# Patient Record
Sex: Female | Born: 1945 | Race: White | Hispanic: No | Smoking: Current every day smoker
Health system: Southern US, Community
[De-identification: ages and names within clinical notes are randomized; demographics above are authoritative.]

## PROBLEM LIST (undated history)

## (undated) DIAGNOSIS — N189 Chronic kidney disease, unspecified: Secondary | ICD-10-CM

## (undated) DIAGNOSIS — K219 Gastro-esophageal reflux disease without esophagitis: Secondary | ICD-10-CM

## (undated) DIAGNOSIS — M1990 Unspecified osteoarthritis, unspecified site: Secondary | ICD-10-CM

## (undated) DIAGNOSIS — Z87442 Personal history of urinary calculi: Secondary | ICD-10-CM

## (undated) DIAGNOSIS — F32A Depression, unspecified: Secondary | ICD-10-CM

## (undated) DIAGNOSIS — Z8719 Personal history of other diseases of the digestive system: Secondary | ICD-10-CM

## (undated) DIAGNOSIS — F329 Major depressive disorder, single episode, unspecified: Secondary | ICD-10-CM

## (undated) HISTORY — PX: ABDOMINAL HYSTERECTOMY: SHX81

## (undated) HISTORY — PX: TONSILLECTOMY: SUR1361

## (undated) HISTORY — PX: TUBAL LIGATION: SHX77

## (undated) HISTORY — PX: COLONOSCOPY W/ BIOPSIES AND POLYPECTOMY: SHX1376

---

## 2006-06-29 HISTORY — PX: CAROTID ENDARTERECTOMY: SUR193

## 2010-06-29 HISTORY — PX: HERNIA REPAIR: SHX51

## 2010-11-28 HISTORY — PX: GASTRIC RESTRICTION SURGERY: SHX653

## 2013-05-09 ENCOUNTER — Other Ambulatory Visit: Payer: Self-pay | Admitting: Orthopedic Surgery

## 2013-05-11 ENCOUNTER — Other Ambulatory Visit: Payer: Self-pay | Admitting: Orthopedic Surgery

## 2013-05-16 ENCOUNTER — Encounter (HOSPITAL_COMMUNITY): Payer: Self-pay | Admitting: Pharmacy Technician

## 2013-05-17 ENCOUNTER — Encounter (HOSPITAL_COMMUNITY)
Admission: RE | Admit: 2013-05-17 | Discharge: 2013-05-17 | Disposition: A | Discharge status: Home / Self Care | Payer: Medicare Other | Source: Ambulatory Visit | Attending: Orthopedic Surgery | Admitting: Orthopedic Surgery

## 2013-05-17 ENCOUNTER — Encounter (HOSPITAL_COMMUNITY): Payer: Self-pay

## 2013-05-17 DIAGNOSIS — Z01810 Encounter for preprocedural cardiovascular examination: Secondary | ICD-10-CM | POA: Insufficient documentation

## 2013-05-17 DIAGNOSIS — Z01818 Encounter for other preprocedural examination: Secondary | ICD-10-CM | POA: Insufficient documentation

## 2013-05-17 DIAGNOSIS — Z01812 Encounter for preprocedural laboratory examination: Secondary | ICD-10-CM | POA: Insufficient documentation

## 2013-05-17 HISTORY — DX: Major depressive disorder, single episode, unspecified: F32.9

## 2013-05-17 HISTORY — DX: Gastro-esophageal reflux disease without esophagitis: K21.9

## 2013-05-17 HISTORY — DX: Personal history of urinary calculi: Z87.442

## 2013-05-17 HISTORY — DX: Unspecified osteoarthritis, unspecified site: M19.90

## 2013-05-17 HISTORY — DX: Personal history of other diseases of the digestive system: Z87.19

## 2013-05-17 HISTORY — DX: Depression, unspecified: F32.A

## 2013-05-17 LAB — CBC WITH DIFFERENTIAL/PLATELET
Basophils Relative: 0 % (ref 0–1)
Eosinophils Absolute: 0.4 10*3/uL (ref 0.0–0.7)
Eosinophils Relative: 6 % — ABNORMAL HIGH (ref 0–5)
Hemoglobin: 14.9 g/dL (ref 12.0–15.0)
Lymphs Abs: 1.7 10*3/uL (ref 0.7–4.0)
MCH: 33.3 pg (ref 26.0–34.0)
MCHC: 34.7 g/dL (ref 30.0–36.0)
MCV: 95.8 fL (ref 78.0–100.0)
Monocytes Absolute: 0.5 10*3/uL (ref 0.1–1.0)
Monocytes Relative: 8 % (ref 3–12)
Neutrophils Relative %: 63 % (ref 43–77)
Platelets: 249 10*3/uL (ref 150–400)
RBC: 4.48 MIL/uL (ref 3.87–5.11)

## 2013-05-17 LAB — URINALYSIS, ROUTINE W REFLEX MICROSCOPIC
Bilirubin Urine: NEGATIVE
Hgb urine dipstick: NEGATIVE
Ketones, ur: NEGATIVE mg/dL
Nitrite: NEGATIVE
Protein, ur: NEGATIVE mg/dL
Urobilinogen, UA: 0.2 mg/dL (ref 0.0–1.0)

## 2013-05-17 LAB — COMPREHENSIVE METABOLIC PANEL
Albumin: 3.8 g/dL (ref 3.5–5.2)
BUN: 13 mg/dL (ref 6–23)
Calcium: 9.6 mg/dL (ref 8.4–10.5)
Creatinine, Ser: 0.94 mg/dL (ref 0.50–1.10)
GFR calc Af Amer: 71 mL/min — ABNORMAL LOW (ref >90)
Glucose, Bld: 75 mg/dL (ref 70–99)
Total Protein: 6.7 g/dL (ref 6.0–8.3)

## 2013-05-17 LAB — TYPE AND SCREEN
ABO/RH(D): O POS
Antibody Screen: NEGATIVE

## 2013-05-17 LAB — PROTIME-INR
INR: 0.95 (ref 0.00–1.49)
Prothrombin Time: 12.5 seconds (ref 11.6–15.2)

## 2013-05-17 LAB — SURGICAL PCR SCREEN
MRSA, PCR: NEGATIVE
Staphylococcus aureus: NEGATIVE

## 2013-05-17 IMAGING — CR DG CHEST 2V
2 series · 2 of 2 positions shown · non-contrast
Comparison: None.

CLINICAL DATA: Preop.

EXAM:
CHEST  2 VIEW

[w chest pa]
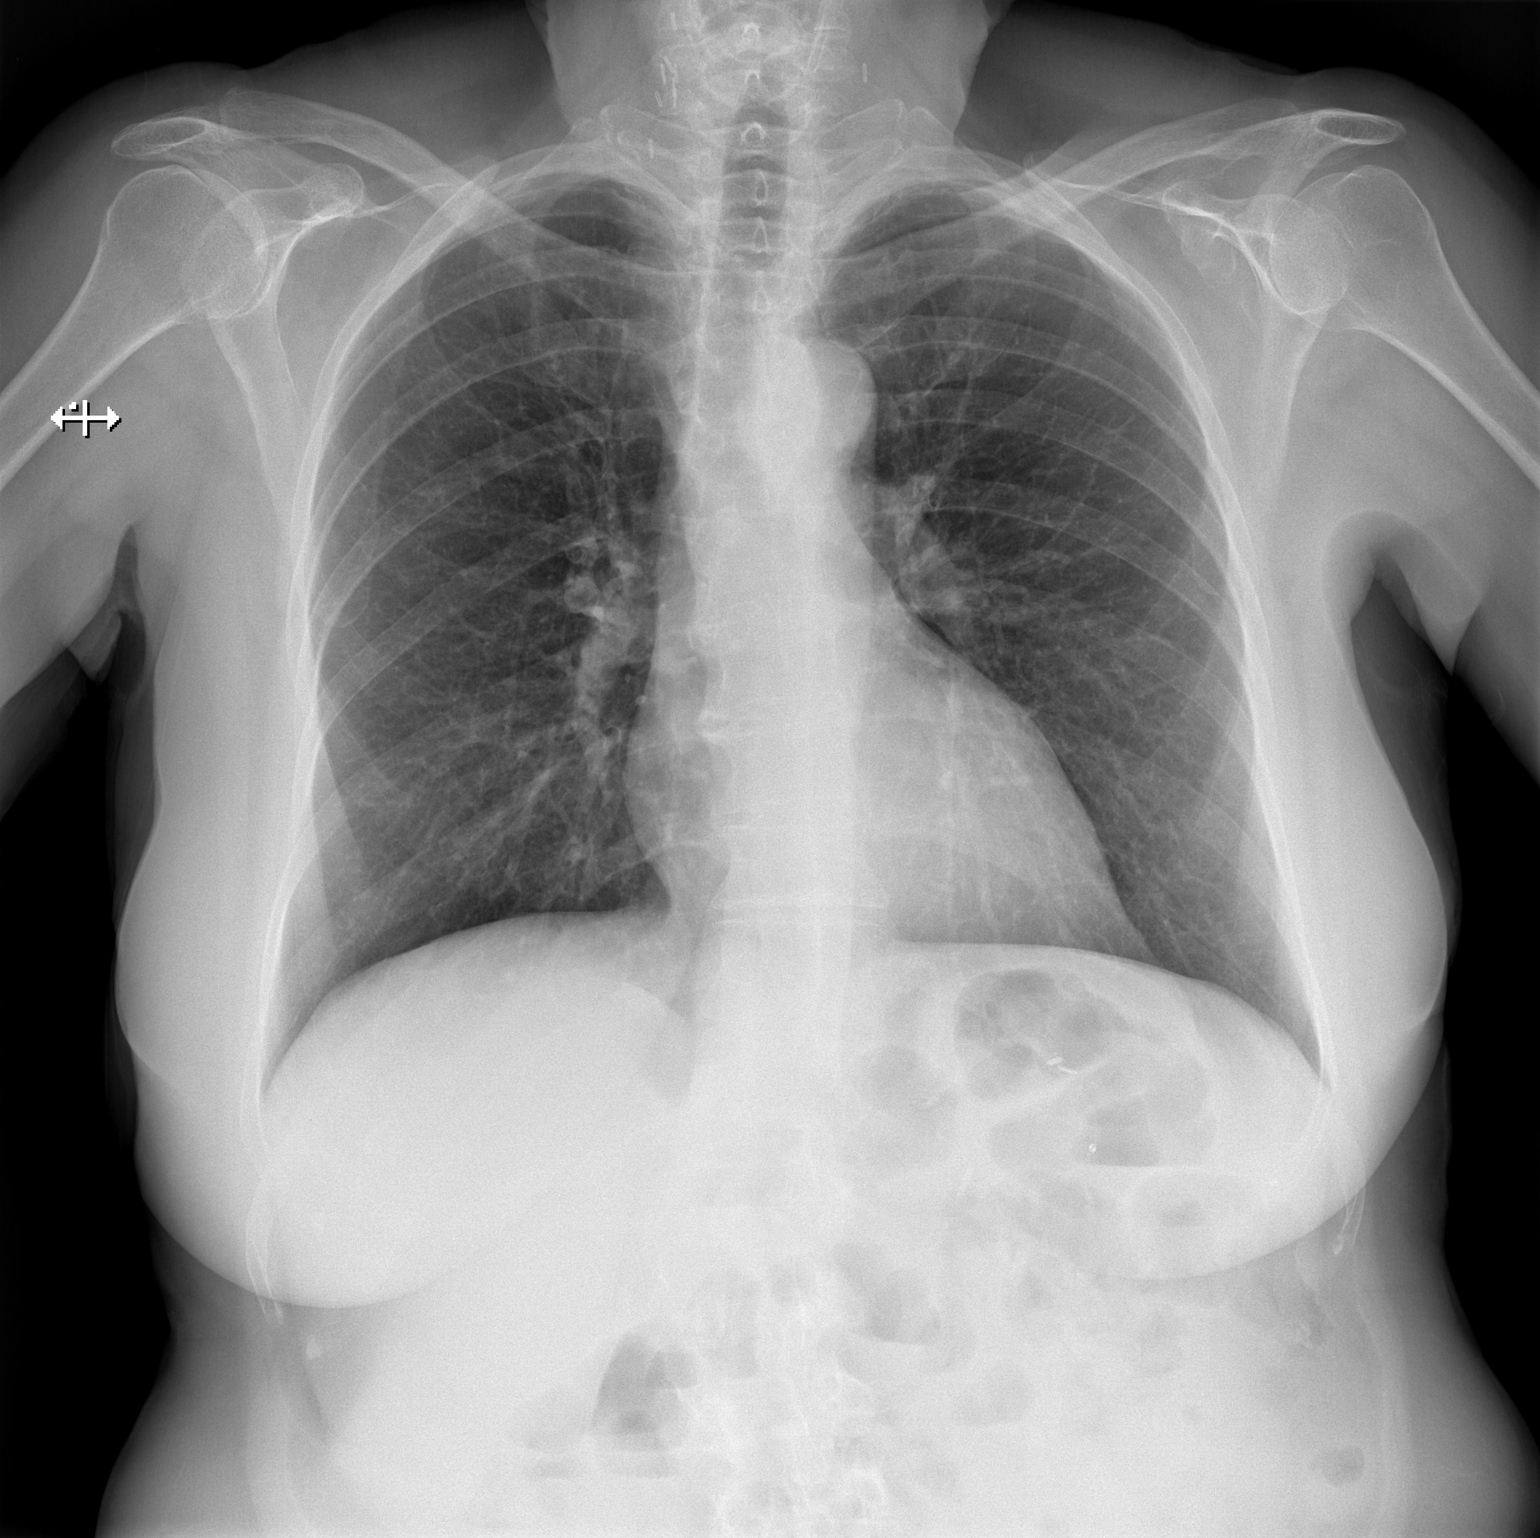

[w chest lat]
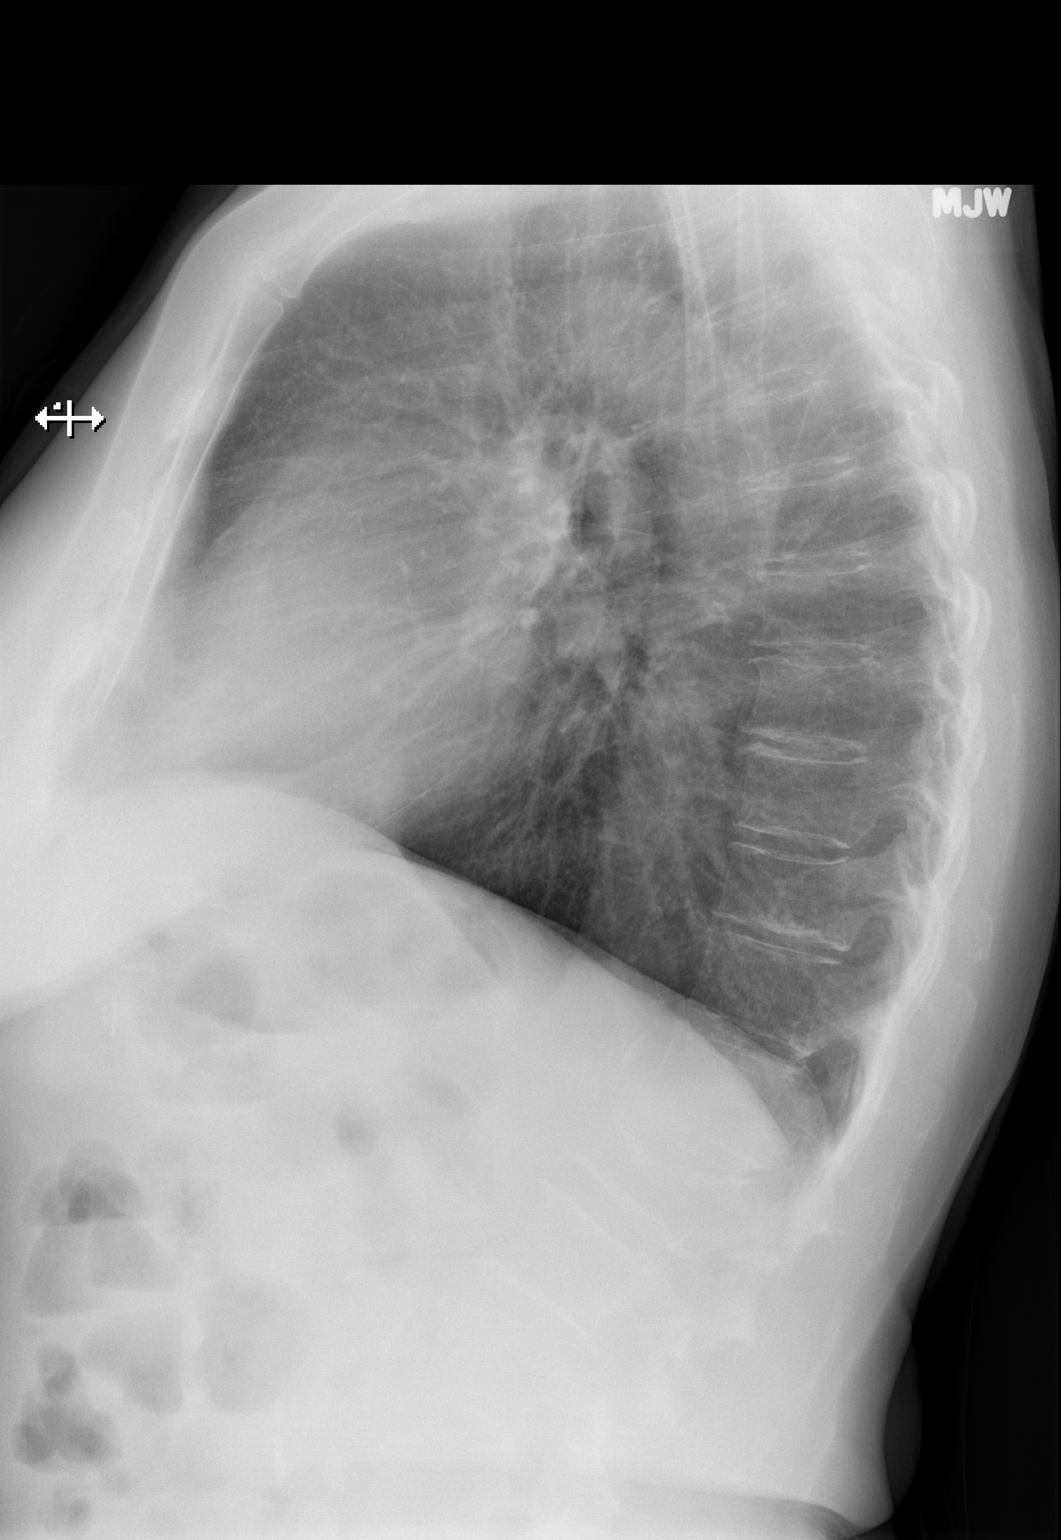

[2 of 2 positions shown; findings below may reference images not displayed]

FINDINGS: The cardiomediastinal silhouette is within normal limits. The lungs
are well inflated and clear. There is no evidence of pleural
effusion or pneumothorax. No acute osseous abnormality is
identified. Surgical clips and suture material are noted in the left
upper quadrant. Surgical clips are also noted in the lower neck.
IMPRESSION: Clear lungs.

## 2013-05-17 IMAGING — CR DG LUMBAR SPINE 2-3V
2 series · 2 of 2 positions shown · non-contrast
Comparison: Lumbar spine MRI [DATE]

CLINICAL DATA: Lumbar spondylosis.

EXAM:
LUMBAR SPINE - 2-3 VIEW

[t lumbar spine ap]
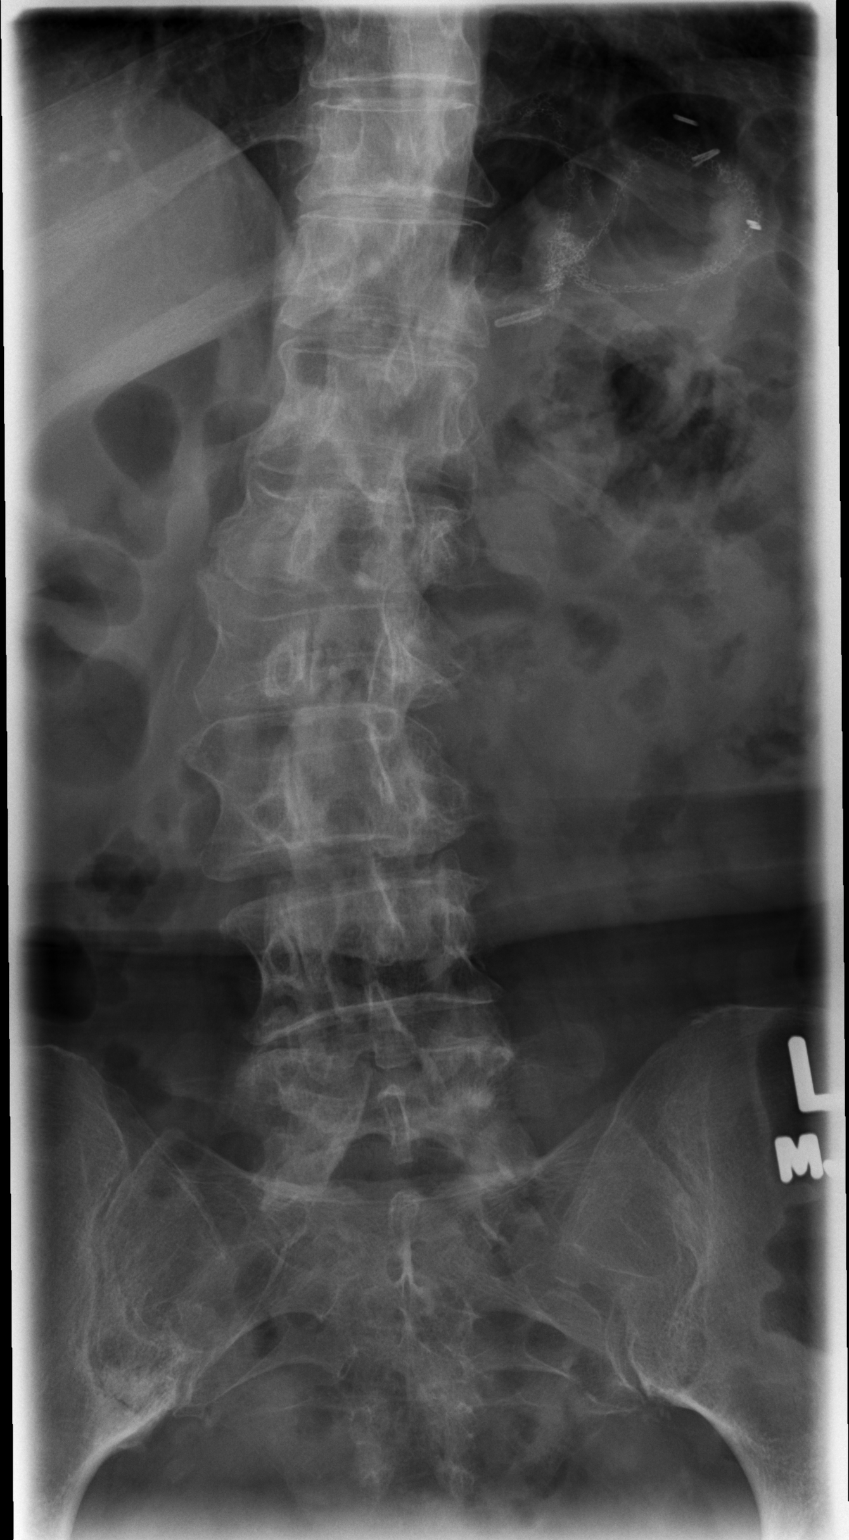

[t lumbar spine lat]
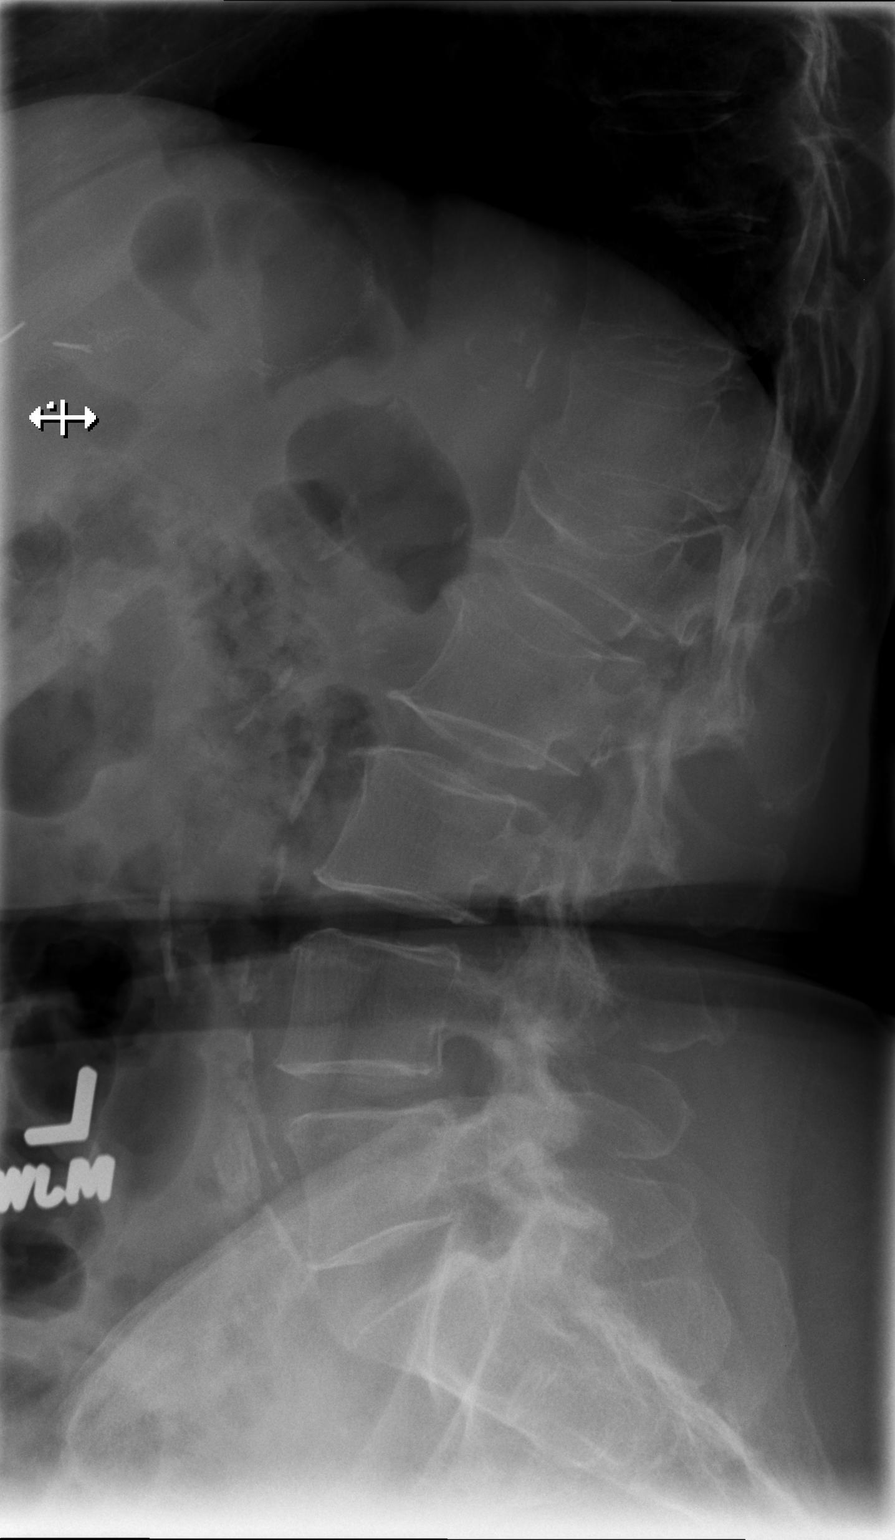

[2 of 2 positions shown; findings below may reference images not displayed]

FINDINGS: There is mild rotatory lumbar dextroscoliosis. Moderate L1
compression fracture is unchanged from prior MRI. Associated
retropulsion is better evaluated on prior MRI. No listhesis is
identified. There is mild disc space narrowing at L4-5. Anterior
osteophytosis is noted at L1-2 and L2-3. There is mild facet
arthrosis in the lower lumbar spine. There is no evidence of acute
fracture. The bones appear osteopenic. Suture material is noted in
the left upper quadrant.
IMPRESSION: Chronic L1 compression fracture and lumbar dextroscoliosis. No acute
osseous abnormality identified.

## 2013-05-17 NOTE — Pre-Procedure Instructions (Addendum)
Vicki Liu  05/17/2013   Your procedure is scheduled on:  05/24/13  Report to [facility] short stay admitting at 630 AM.  Call this number if you have problems the morning of surgery: [phone]   Remember:   Do not eat food or drink liquids after midnight.   Take these medicines the morning of surgery with A SIP OF WATER: lexapro, omeprazole, pain med if needed          STOP all herbel meds, nsaids (aleve,naproxen,advil,ibuprofen) 5 days prior to surgery including vitamins   Do not wear jewelry, make-up or nail polish.  Do not wear lotions, powders, or perfumes. You may wear deodorant.  Do not shave 48 hours prior to surgery. Men may shave face and neck.  Do not bring valuables to the hospital.  [facility] is not responsible                  for any belongings or valuables.               Contacts, dentures or bridgework may not be worn into surgery.  Leave suitcase in the car. After surgery it may be brought to your room.  For patients admitted to the hospital, discharge time is determined by your                treatment team.               Patients discharged the day of surgery will not be allowed to drive  home.  Name and phone number of your driver:   Special Instructions: Incentive Spirometry - Practice and bring it with you on the day of surgery. Shower using CHG 2 nights before surgery and the night before surgery.  If you shower the day of surgery use CHG.  Use special wash - you have one bottle of CHG for all showers.  You should use approximately 1/3 of the bottle for each shower.   Please read over the following fact sheets that you were given: Pain Booklet, Coughing and Deep Breathing, Blood Transfusion Information, MRSA Information and Surgical Site Infection Prevention

## 2013-05-17 NOTE — Progress Notes (Addendum)
req'd office note, ekg, any tests from dr [facility] [phone]

## 2013-05-23 MED ORDER — CEFAZOLIN SODIUM-DEXTROSE 2-3 GM-% IV SOLR
2.0000 g | INTRAVENOUS | Status: AC
  Administered 2013-05-24 (×2): 2 g via INTRAVENOUS
  Filled 2013-05-23: qty 50

## 2013-05-24 ENCOUNTER — Inpatient Hospital Stay (HOSPITAL_COMMUNITY): Payer: Medicare Other

## 2013-05-24 ENCOUNTER — Encounter (HOSPITAL_COMMUNITY): Payer: Medicare Other | Admitting: Certified Registered"

## 2013-05-24 ENCOUNTER — Encounter (HOSPITAL_COMMUNITY)
Admission: RE | Disposition: A | Discharge status: Home Health | Payer: Medicare Other | Source: Ambulatory Visit | Attending: Orthopedic Surgery

## 2013-05-24 ENCOUNTER — Encounter (HOSPITAL_COMMUNITY): Payer: Self-pay | Admitting: *Deleted

## 2013-05-24 ENCOUNTER — Inpatient Hospital Stay (HOSPITAL_COMMUNITY): Payer: Medicare Other | Admitting: Certified Registered"

## 2013-05-24 ENCOUNTER — Inpatient Hospital Stay (HOSPITAL_COMMUNITY)
Admission: RE | Admit: 2013-05-24 | Discharge: 2013-05-27 | DRG: 455 | Disposition: A | Discharge status: Home Health | Payer: Medicare Other | Source: Ambulatory Visit | Attending: Orthopedic Surgery | Admitting: Orthopedic Surgery

## 2013-05-24 DIAGNOSIS — M48061 Spinal stenosis, lumbar region without neurogenic claudication: Secondary | ICD-10-CM | POA: Diagnosis present

## 2013-05-24 DIAGNOSIS — T4275XA Adverse effect of unspecified antiepileptic and sedative-hypnotic drugs, initial encounter: Secondary | ICD-10-CM | POA: Diagnosis not present

## 2013-05-24 DIAGNOSIS — F17200 Nicotine dependence, unspecified, uncomplicated: Secondary | ICD-10-CM | POA: Diagnosis present

## 2013-05-24 DIAGNOSIS — Z7982 Long term (current) use of aspirin: Secondary | ICD-10-CM

## 2013-05-24 DIAGNOSIS — F3289 Other specified depressive episodes: Secondary | ICD-10-CM | POA: Diagnosis present

## 2013-05-24 DIAGNOSIS — F329 Major depressive disorder, single episode, unspecified: Secondary | ICD-10-CM | POA: Diagnosis present

## 2013-05-24 DIAGNOSIS — M4180 Other forms of scoliosis, site unspecified: Secondary | ICD-10-CM | POA: Diagnosis not present

## 2013-05-24 DIAGNOSIS — R0609 Other forms of dyspnea: Secondary | ICD-10-CM | POA: Diagnosis not present

## 2013-05-24 DIAGNOSIS — M129 Arthropathy, unspecified: Secondary | ICD-10-CM | POA: Diagnosis present

## 2013-05-24 DIAGNOSIS — M5410 Radiculopathy, site unspecified: Secondary | ICD-10-CM | POA: Diagnosis present

## 2013-05-24 DIAGNOSIS — K219 Gastro-esophageal reflux disease without esophagitis: Secondary | ICD-10-CM | POA: Diagnosis present

## 2013-05-24 DIAGNOSIS — R0989 Other specified symptoms and signs involving the circulatory and respiratory systems: Secondary | ICD-10-CM | POA: Diagnosis not present

## 2013-05-24 DIAGNOSIS — Z79899 Other long term (current) drug therapy: Secondary | ICD-10-CM

## 2013-05-24 HISTORY — PX: ANTERIOR LAT LUMBAR FUSION: SHX1168

## 2013-05-24 LAB — POCT I-STAT 7, (LYTES, BLD GAS, ICA,H+H)
Acid-base deficit: 4 mmol/L — ABNORMAL HIGH (ref 0.0–2.0)
Acid-base deficit: 4 mmol/L — ABNORMAL HIGH (ref 0.0–2.0)
Bicarbonate: 25.3 mEq/L — ABNORMAL HIGH (ref 20.0–24.0)
Calcium, Ion: 1.27 mmol/L (ref 1.13–1.30)
Calcium, Ion: 1.28 mmol/L (ref 1.13–1.30)
HCT: 38 % (ref 36.0–46.0)
HCT: 38 % (ref 36.0–46.0)
O2 Saturation: 100 %
O2 Saturation: 96 %
Potassium: 4.1 mEq/L (ref 3.5–5.1)
Sodium: 139 mEq/L (ref 135–145)
pO2, Arterial: 98 mmHg (ref 80.0–100.0)

## 2013-05-24 LAB — POCT I-STAT 4, (NA,K, GLUC, HGB,HCT)
Glucose, Bld: 94 mg/dL (ref 70–99)
HCT: 35 % — ABNORMAL LOW (ref 36.0–46.0)
Potassium: 3.8 mEq/L (ref 3.5–5.1)
Sodium: 143 mEq/L (ref 135–145)

## 2013-05-24 IMAGING — RF DG LUMBAR SPINE COMPLETE 4+V
1 series · 4 of 4 positions shown · non-contrast
Comparison: Lumbar spine radiographs [DATE]

CLINICAL DATA: L2-5 XLIF.

EXAM:
LUMBAR SPINE - COMPLETE 4+ VIEW; DG C-ARM GT 120 MIN

[Series 1: run · 4 of 4 slices shown]
[im 1/4]
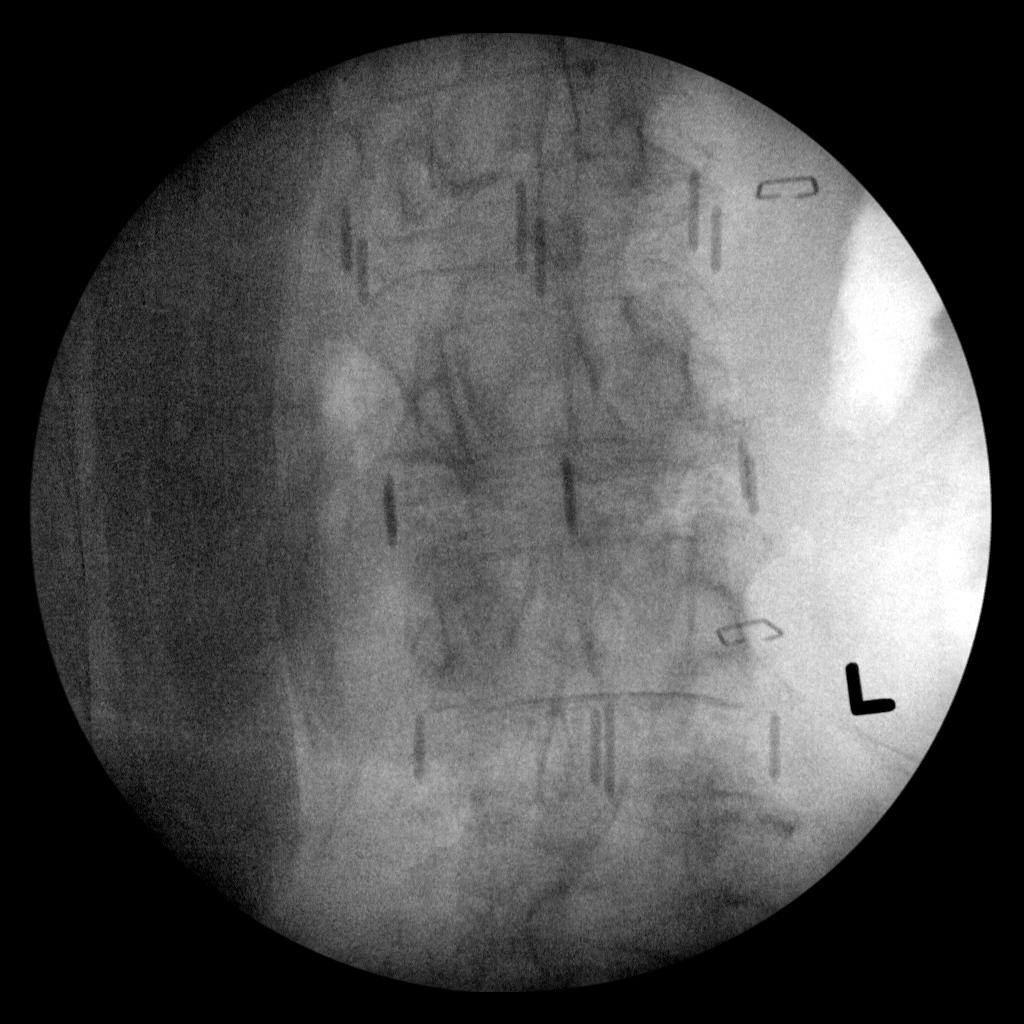
[im 2/4]
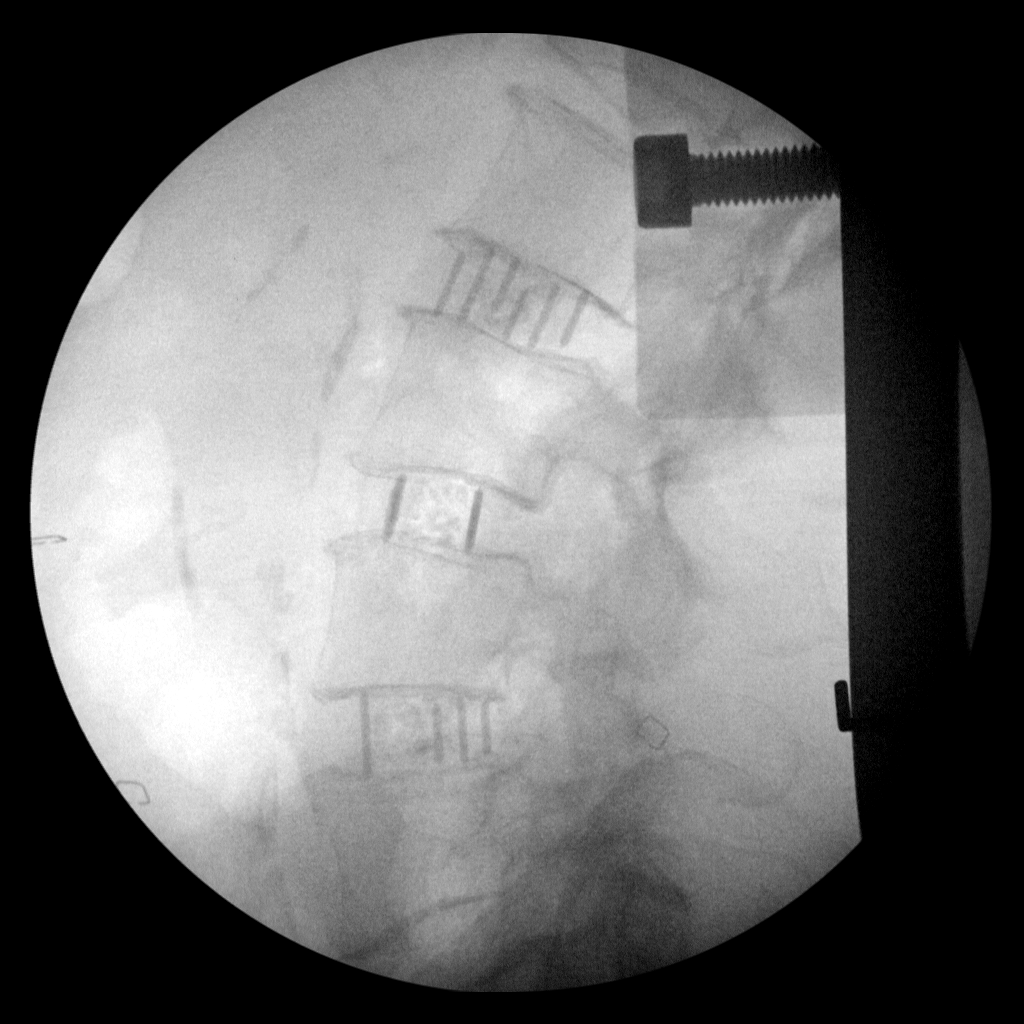
[im 3/4]
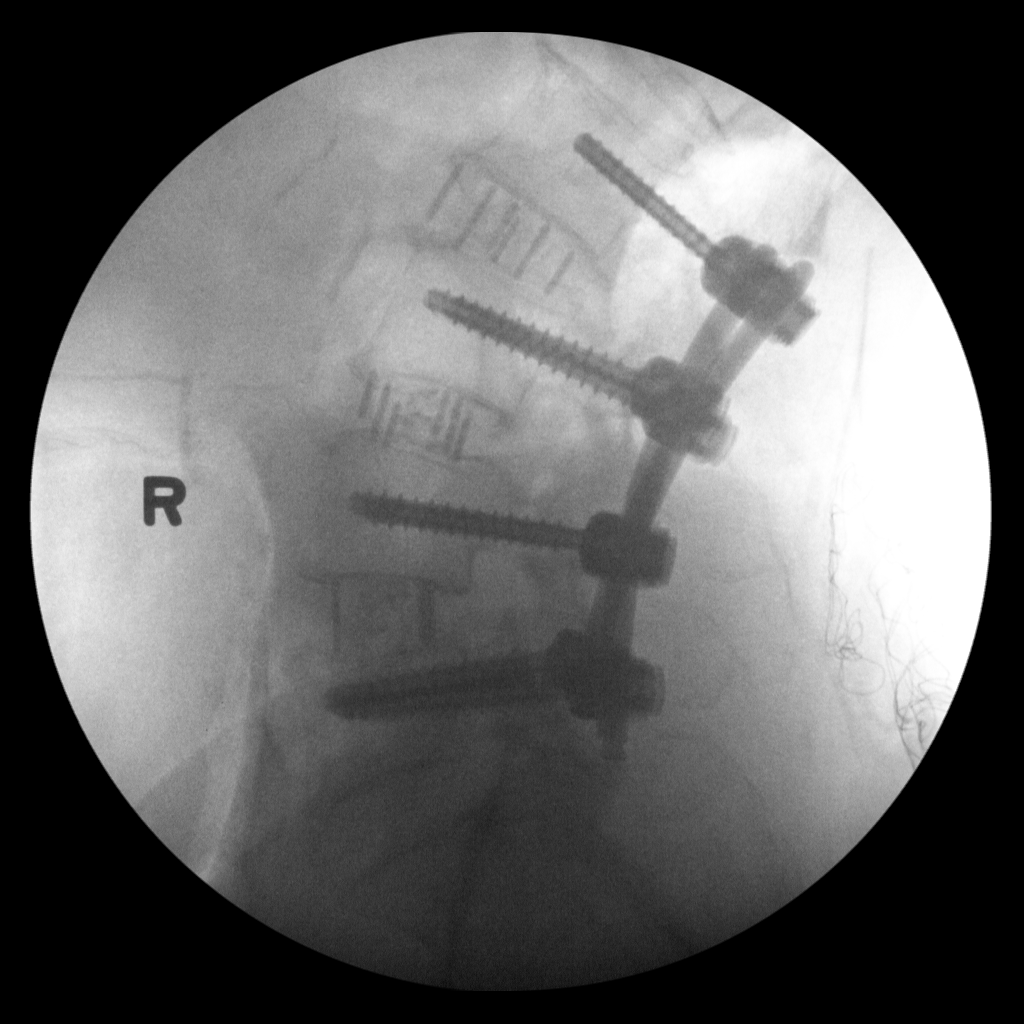
[im 4/4]
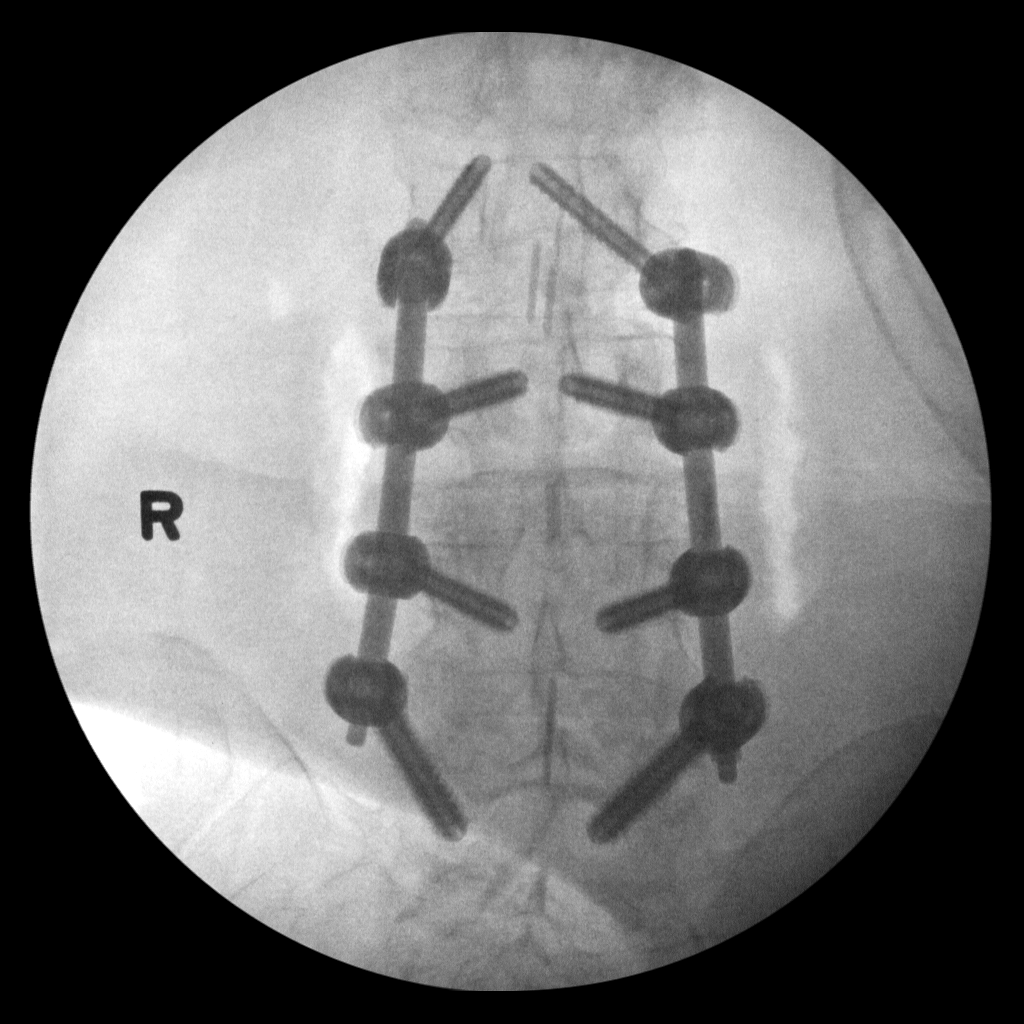

[4 of 4 positions shown; findings below may reference images not displayed]

FINDINGS: Four spot fluoroscopic images of the lumbar spine are submitted from
the operating room. These demonstrate the placement of XLIF devices
at L2-3, L3-4 and L4-5. There are paired pedicle screws and
interconnecting rods at each level. The hardware appears well
positioned. No complications are identified.
IMPRESSION: Intraoperative views during L2-5 XLIF. No demonstrated complication.

## 2013-05-24 SURGERY — ANTERIOR LATERAL LUMBAR FUSION 3 LEVELS
Anesthesia: General | Site: Spine Lumbar | Wound class: Clean

## 2013-05-24 MED ORDER — ZOLPIDEM TARTRATE 5 MG PO TABS: 5.0000 mg | ORAL_TABLET | Freq: Every evening | ORAL | Status: DC | PRN

## 2013-05-24 MED ORDER — ONDANSETRON HCL 4 MG PO TABS
4.0000 mg | ORAL_TABLET | Freq: Three times a day (TID) | ORAL | Status: DC | PRN
  Administered 2013-05-27: 4 mg via ORAL
  Filled 2013-05-24: qty 1

## 2013-05-24 MED ORDER — PANTOPRAZOLE SODIUM 40 MG PO TBEC
40.0000 mg | DELAYED_RELEASE_TABLET | Freq: Every day | ORAL | Status: DC
  Administered 2013-05-25: 40 mg via ORAL
  Filled 2013-05-24 (×2): qty 1

## 2013-05-24 MED ORDER — ESCITALOPRAM OXALATE 20 MG PO TABS
20.0000 mg | ORAL_TABLET | Freq: Every day | ORAL | Status: DC
  Administered 2013-05-25 – 2013-05-27 (×3): 20 mg via ORAL
  Filled 2013-05-24 (×3): qty 1

## 2013-05-24 MED ORDER — ONDANSETRON HCL 4 MG/2ML IJ SOLN: 4.0000 mg | INTRAMUSCULAR | Status: DC | PRN

## 2013-05-24 MED ORDER — FLEET ENEMA 7-19 GM/118ML RE ENEM: 1.0000 | ENEMA | Freq: Once | RECTAL | Status: AC | PRN

## 2013-05-24 MED ORDER — SODIUM CHLORIDE 0.9 % IV SOLN: 250.0000 mL | INTRAVENOUS | Status: DC

## 2013-05-24 MED ORDER — PROMETHAZINE HCL 12.5 MG PO TABS
12.5000 mg | ORAL_TABLET | Freq: Four times a day (QID) | ORAL | Status: DC | PRN
  Filled 2013-05-24: qty 1

## 2013-05-24 MED ORDER — HYDROMORPHONE 0.3 MG/ML IV SOLN
INTRAVENOUS | Status: AC
  Filled 2013-05-24: qty 25

## 2013-05-24 MED ORDER — BUPIVACAINE-EPINEPHRINE (PF) 0.25% -1:200000 IJ SOLN
INTRAMUSCULAR | Status: AC
  Filled 2013-05-24: qty 30

## 2013-05-24 MED ORDER — ACETAMINOPHEN 650 MG RE SUPP: 650.0000 mg | RECTAL | Status: DC | PRN

## 2013-05-24 MED ORDER — HYDROMORPHONE HCL 2 MG PO TABS: 1.0000 mg | ORAL_TABLET | ORAL | Status: DC | PRN

## 2013-05-24 MED ORDER — SODIUM CHLORIDE 0.9 % IV SOLN
INTRAVENOUS | Status: DC
  Administered 2013-05-24: 23:00:00 via INTRAVENOUS
  Administered 2013-05-25: 75 mL/h via INTRAVENOUS

## 2013-05-24 MED ORDER — CALCIUM-VITAMIN D 500-200 MG-UNIT PO TABS: 2.0000 | ORAL_TABLET | Freq: Two times a day (BID) | ORAL | Status: DC

## 2013-05-24 MED ORDER — FENTANYL CITRATE 0.05 MG/ML IJ SOLN
INTRAMUSCULAR | Status: DC | PRN
  Administered 2013-05-24: 50 ug via INTRAVENOUS
  Administered 2013-05-24: 100 ug via INTRAVENOUS
  Administered 2013-05-24 (×8): 50 ug via INTRAVENOUS
  Administered 2013-05-24 (×3): 100 ug via INTRAVENOUS
  Administered 2013-05-24 (×2): 50 ug via INTRAVENOUS
  Administered 2013-05-24: 150 ug via INTRAVENOUS
  Administered 2013-05-24: 50 ug via INTRAVENOUS
  Administered 2013-05-24 (×2): 100 ug via INTRAVENOUS

## 2013-05-24 MED ORDER — ONDANSETRON HCL 4 MG/2ML IJ SOLN: 4.0000 mg | Freq: Four times a day (QID) | INTRAMUSCULAR | Status: DC | PRN

## 2013-05-24 MED ORDER — HYDROMORPHONE HCL PF 1 MG/ML IJ SOLN
INTRAMUSCULAR | Status: DC | PRN
  Administered 2013-05-24 (×2): 0.5 mg via INTRAVENOUS

## 2013-05-24 MED ORDER — CEFAZOLIN SODIUM 1-5 GM-% IV SOLN
1.0000 g | Freq: Three times a day (TID) | INTRAVENOUS | Status: AC
  Administered 2013-05-24 – 2013-05-25 (×2): 1 g via INTRAVENOUS
  Filled 2013-05-24 (×2): qty 50

## 2013-05-24 MED ORDER — FENTANYL CITRATE 0.05 MG/ML IJ SOLN
25.0000 ug | INTRAMUSCULAR | Status: DC | PRN
  Administered 2013-05-24 (×5): 25 ug via INTRAVENOUS

## 2013-05-24 MED ORDER — LACTATED RINGERS IV SOLN
INTRAVENOUS | Status: DC
  Administered 2013-05-24 (×2): via INTRAVENOUS

## 2013-05-24 MED ORDER — FENTANYL CITRATE 0.05 MG/ML IJ SOLN
INTRAMUSCULAR | Status: AC
  Filled 2013-05-24: qty 2

## 2013-05-24 MED ORDER — DOCUSATE SODIUM 100 MG PO CAPS
100.0000 mg | ORAL_CAPSULE | Freq: Two times a day (BID) | ORAL | Status: DC
  Administered 2013-05-25 – 2013-05-27 (×5): 100 mg via ORAL
  Filled 2013-05-24 (×6): qty 1

## 2013-05-24 MED ORDER — LABETALOL HCL 5 MG/ML IV SOLN
5.0000 mg | Freq: Once | INTRAVENOUS | Status: AC
  Administered 2013-05-24: 5 mg via INTRAVENOUS

## 2013-05-24 MED ORDER — ROCURONIUM BROMIDE 100 MG/10ML IV SOLN: INTRAVENOUS | Status: DC | PRN

## 2013-05-24 MED ORDER — PROPOFOL INFUSION 10 MG/ML OPTIME
INTRAVENOUS | Status: DC | PRN
  Administered 2013-05-24: 25 ug/kg/min via INTRAVENOUS

## 2013-05-24 MED ORDER — PROMETHAZINE HCL 25 MG/ML IJ SOLN
12.5000 mg | Freq: Four times a day (QID) | INTRAMUSCULAR | Status: DC | PRN
  Administered 2013-05-27: 12.5 mg via INTRAVENOUS
  Filled 2013-05-24: qty 1

## 2013-05-24 MED ORDER — EPHEDRINE SULFATE 50 MG/ML IJ SOLN
INTRAMUSCULAR | Status: DC | PRN
  Administered 2013-05-24 (×3): 5 mg via INTRAVENOUS
  Administered 2013-05-24 (×2): 10 mg via INTRAVENOUS

## 2013-05-24 MED ORDER — DIPHENHYDRAMINE HCL 12.5 MG/5ML PO ELIX
12.5000 mg | ORAL_SOLUTION | Freq: Four times a day (QID) | ORAL | Status: DC | PRN
  Filled 2013-05-24: qty 5

## 2013-05-24 MED ORDER — THROMBIN 20000 UNITS EX SOLR
OROMUCOSAL | Status: DC | PRN
  Administered 2013-05-24: 08:00:00 via TOPICAL

## 2013-05-24 MED ORDER — THROMBIN 20000 UNITS EX SOLR
CUTANEOUS | Status: AC
  Filled 2013-05-24: qty 20000

## 2013-05-24 MED ORDER — POVIDONE-IODINE 7.5 % EX SOLN
Freq: Once | CUTANEOUS | Status: DC
  Filled 2013-05-24: qty 118

## 2013-05-24 MED ORDER — LACTATED RINGERS IV SOLN
INTRAVENOUS | Status: DC | PRN
  Administered 2013-05-24 (×3): via INTRAVENOUS

## 2013-05-24 MED ORDER — NALOXONE HCL 0.4 MG/ML IJ SOLN: 0.4000 mg | INTRAMUSCULAR | Status: DC | PRN

## 2013-05-24 MED ORDER — ALUM & MAG HYDROXIDE-SIMETH 200-200-20 MG/5ML PO SUSP
30.0000 mL | Freq: Four times a day (QID) | ORAL | Status: DC | PRN
  Administered 2013-05-27: 30 mL via ORAL
  Filled 2013-05-24: qty 30

## 2013-05-24 MED ORDER — ARTIFICIAL TEARS OP OINT
TOPICAL_OINTMENT | OPHTHALMIC | Status: DC | PRN
  Administered 2013-05-24: 1 via OPHTHALMIC

## 2013-05-24 MED ORDER — CALCIUM CARBONATE-VITAMIN D 500-200 MG-UNIT PO TABS
2.0000 | ORAL_TABLET | Freq: Two times a day (BID) | ORAL | Status: DC
  Administered 2013-05-25 – 2013-05-27 (×4): 2 via ORAL
  Filled 2013-05-24 (×6): qty 2

## 2013-05-24 MED ORDER — LABETALOL HCL 5 MG/ML IV SOLN
INTRAVENOUS | Status: AC
  Filled 2013-05-24: qty 4

## 2013-05-24 MED ORDER — BUPIVACAINE-EPINEPHRINE PF 0.25-1:200000 % IJ SOLN
INTRAMUSCULAR | Status: DC | PRN
  Administered 2013-05-24: 17 mL via PERINEURAL

## 2013-05-24 MED ORDER — BISACODYL 5 MG PO TBEC: 5.0000 mg | DELAYED_RELEASE_TABLET | Freq: Every day | ORAL | Status: DC | PRN

## 2013-05-24 MED ORDER — NALOXONE HCL 0.4 MG/ML IJ SOLN
INTRAMUSCULAR | Status: AC
  Filled 2013-05-24: qty 1

## 2013-05-24 MED ORDER — SODIUM CHLORIDE 0.9 % IJ SOLN: 3.0000 mL | INTRAMUSCULAR | Status: DC | PRN

## 2013-05-24 MED ORDER — DIAZEPAM 5 MG PO TABS: 5.0000 mg | ORAL_TABLET | Freq: Four times a day (QID) | ORAL | Status: DC | PRN

## 2013-05-24 MED ORDER — PROPOFOL 10 MG/ML IV BOLUS
INTRAVENOUS | Status: DC | PRN
  Administered 2013-05-24: 40 mg via INTRAVENOUS
  Administered 2013-05-24: 160 mg via INTRAVENOUS

## 2013-05-24 MED ORDER — PHENOL 1.4 % MT LIQD: 1.0000 | OROMUCOSAL | Status: DC | PRN

## 2013-05-24 MED ORDER — CEFAZOLIN SODIUM-DEXTROSE 2-3 GM-% IV SOLR
INTRAVENOUS | Status: AC
  Filled 2013-05-24: qty 50

## 2013-05-24 MED ORDER — HYDROMORPHONE HCL PF 1 MG/ML IJ SOLN
0.5000 mg | INTRAMUSCULAR | Status: DC | PRN
  Administered 2013-05-26: 1 mg via INTRAVENOUS
  Filled 2013-05-24 (×2): qty 1

## 2013-05-24 MED ORDER — PROMETHAZINE HCL 25 MG RE SUPP: 12.5000 mg | Freq: Four times a day (QID) | RECTAL | Status: DC | PRN

## 2013-05-24 MED ORDER — MIDAZOLAM HCL 5 MG/5ML IJ SOLN
INTRAMUSCULAR | Status: DC | PRN
  Administered 2013-05-24 (×3): 1 mg via INTRAVENOUS

## 2013-05-24 MED ORDER — HYDROMORPHONE 0.3 MG/ML IV SOLN
INTRAVENOUS | Status: DC
  Administered 2013-05-25: 2.33 mg via INTRAVENOUS
  Administered 2013-05-25: 3.39 mg via INTRAVENOUS
  Administered 2013-05-25: 1.99 mg via INTRAVENOUS
  Administered 2013-05-25: 0.2 mg via INTRAVENOUS
  Administered 2013-05-25: 03:00:00 via INTRAVENOUS
  Administered 2013-05-26: 1.99 mg via INTRAVENOUS
  Administered 2013-05-26: 0.739 mg via INTRAVENOUS
  Administered 2013-05-26: 0.999 mg via INTRAVENOUS
  Filled 2013-05-24: qty 25

## 2013-05-24 MED ORDER — LIDOCAINE HCL (CARDIAC) 20 MG/ML IV SOLN
INTRAVENOUS | Status: DC | PRN
  Administered 2013-05-24: 70 mg via INTRAVENOUS

## 2013-05-24 MED ORDER — ONDANSETRON HCL 4 MG/2ML IJ SOLN
INTRAMUSCULAR | Status: DC | PRN
  Administered 2013-05-24: 4 mg via INTRAVENOUS

## 2013-05-24 MED ORDER — MENTHOL 3 MG MT LOZG: 1.0000 | LOZENGE | OROMUCOSAL | Status: DC | PRN

## 2013-05-24 MED ORDER — SODIUM CHLORIDE 0.9 % IJ SOLN: 9.0000 mL | INTRAMUSCULAR | Status: DC | PRN

## 2013-05-24 MED ORDER — BUPIVACAINE-EPINEPHRINE 0.25% -1:200000 IJ SOLN
INTRAMUSCULAR | Status: DC | PRN
  Administered 2013-05-24: 7 mL

## 2013-05-24 MED ORDER — ALBUMIN HUMAN 5 % IV SOLN
INTRAVENOUS | Status: DC | PRN
  Administered 2013-05-24: 16:00:00 via INTRAVENOUS

## 2013-05-24 MED ORDER — MECLIZINE HCL 25 MG PO TABS
25.0000 mg | ORAL_TABLET | Freq: Every day | ORAL | Status: DC
  Administered 2013-05-25 – 2013-05-26 (×2): 25 mg via ORAL
  Filled 2013-05-24 (×4): qty 1

## 2013-05-24 MED ORDER — SODIUM CHLORIDE 0.9 % IJ SOLN
3.0000 mL | Freq: Two times a day (BID) | INTRAMUSCULAR | Status: DC
  Administered 2013-05-24 – 2013-05-27 (×4): 3 mL via INTRAVENOUS

## 2013-05-24 MED ORDER — DIPHENHYDRAMINE HCL 50 MG/ML IJ SOLN: 12.5000 mg | Freq: Four times a day (QID) | INTRAMUSCULAR | Status: DC | PRN

## 2013-05-24 MED ORDER — ACETAMINOPHEN 325 MG PO TABS
650.0000 mg | ORAL_TABLET | ORAL | Status: DC | PRN
  Administered 2013-05-26 – 2013-05-27 (×2): 650 mg via ORAL
  Filled 2013-05-24 (×2): qty 2

## 2013-05-24 MED ORDER — SENNOSIDES-DOCUSATE SODIUM 8.6-50 MG PO TABS
1.0000 | ORAL_TABLET | Freq: Every evening | ORAL | Status: DC | PRN
  Filled 2013-05-24: qty 1

## 2013-05-24 SURGICAL SUPPLY — 122 items
APPLIER CLIP 11 MED OPEN (CLIP)
ATTRACTOMAT 16X20 MAGNETIC DRP (DRAPES) IMPLANT
BENZOIN TINCTURE PRP APPL 2/3 (GAUZE/BANDAGES/DRESSINGS) ×6 IMPLANT
BLADE SURG 10 STRL SS (BLADE) ×6 IMPLANT
BLADE SURG ROTATE 9660 (MISCELLANEOUS) IMPLANT
BUR ROUND PRECISION 4.0 (BURR) IMPLANT
CARTRIDGE OIL MAESTRO DRILL (MISCELLANEOUS) IMPLANT
CLIP APPLIE 11 MED OPEN (CLIP) IMPLANT
CLIP NEUROVISION LG (CLIP) ×3 IMPLANT
CLOTH BEACON ORANGE TIMEOUT ST (SAFETY) ×3 IMPLANT
CLSR STERI-STRIP ANTIMIC 1/2X4 (GAUZE/BANDAGES/DRESSINGS) ×6 IMPLANT
CONT SPEC STER OR (MISCELLANEOUS) IMPLANT
CORDS BIPOLAR (ELECTRODE) ×3 IMPLANT
COROENT XL 12X22X55 (Orthopedic Implant) ×3 IMPLANT
COVER SURGICAL LIGHT HANDLE (MISCELLANEOUS) ×6 IMPLANT
DIFFUSER DRILL AIR PNEUMATIC (MISCELLANEOUS) IMPLANT
DRAIN CHANNEL 15F RND FF W/TCR (WOUND CARE) IMPLANT
DRAPE C-ARM 42X72 X-RAY (DRAPES) ×6 IMPLANT
DRAPE C-ARMOR (DRAPES) ×6 IMPLANT
DRAPE ORTHO SPLIT 77X108 STRL (DRAPES) ×1
DRAPE POUCH INSTRU U-SHP 10X18 (DRAPES) ×3 IMPLANT
DRAPE SURG 17X23 STRL (DRAPES) ×12 IMPLANT
DRAPE SURG ORHT 6 SPLT 77X108 (DRAPES) ×2 IMPLANT
DRAPE U-SHAPE 47X51 STRL (DRAPES) ×6 IMPLANT
DRSG MEPILEX BORDER 4X12 (GAUZE/BANDAGES/DRESSINGS) IMPLANT
DRSG MEPILEX BORDER 4X8 (GAUZE/BANDAGES/DRESSINGS) IMPLANT
DURAPREP 26ML APPLICATOR (WOUND CARE) ×6 IMPLANT
ELECT BLADE 4.0 EZ CLEAN MEGAD (MISCELLANEOUS) ×3
ELECT CAUTERY BLADE 6.4 (BLADE) ×6 IMPLANT
ELECT REM PT RETURN 9FT ADLT (ELECTROSURGICAL) ×6
ELECTRODE BLDE 4.0 EZ CLN MEGD (MISCELLANEOUS) ×2 IMPLANT
ELECTRODE REM PT RTRN 9FT ADLT (ELECTROSURGICAL) ×4 IMPLANT
EVACUATOR SILICONE 100CC (DRAIN) IMPLANT
FILTER STRAW FLUID ASPIR (MISCELLANEOUS) IMPLANT
GAUZE SPONGE 4X4 16PLY XRAY LF (GAUZE/BANDAGES/DRESSINGS) ×15 IMPLANT
GLOVE BIO SURGEON STRL SZ7 (GLOVE) ×6 IMPLANT
GLOVE BIO SURGEON STRL SZ8 (GLOVE) ×3 IMPLANT
GLOVE BIOGEL PI IND STRL 7.0 (GLOVE) ×4 IMPLANT
GLOVE BIOGEL PI IND STRL 8 (GLOVE) ×2 IMPLANT
GLOVE BIOGEL PI IND STRL 8.5 (GLOVE) ×2 IMPLANT
GLOVE BIOGEL PI INDICATOR 7.0 (GLOVE) ×2
GLOVE BIOGEL PI INDICATOR 8 (GLOVE) ×1
GLOVE BIOGEL PI INDICATOR 8.5 (GLOVE) ×1
GLOVE ECLIPSE 8.5 STRL (GLOVE) ×3 IMPLANT
GOWN PREVENTION PLUS XLARGE (GOWN DISPOSABLE) ×9 IMPLANT
GOWN STRL NON-REIN LRG LVL3 (GOWN DISPOSABLE) ×6 IMPLANT
GUIDEWIRE BLUNT VIPER II 1.45 (WIRE) ×3 IMPLANT
GUIDEWIRE SHARP VIPER II (WIRE) ×24 IMPLANT
IMPL COROENT XL 10X18X55 (Intraocular Lens) ×2 IMPLANT
IMPL COROENT XL 12X18X55 ×2 IMPLANT
IMPLANT COROENT XL 10X18X55 (Intraocular Lens) ×3 IMPLANT
IMPLANT COROENT XL 12X18X55 ×3 IMPLANT
IV CATH 14GX2 1/4 (CATHETERS) ×3 IMPLANT
KIT BASIN OR (CUSTOM PROCEDURE TRAY) ×3 IMPLANT
KIT DILATOR XLIF 5 (KITS) ×2 IMPLANT
KIT MAXCESS (KITS) ×3 IMPLANT
KIT NEEDLE NVM5 EMG ELECT (KITS) ×2 IMPLANT
KIT NEEDLE NVM5 EMG ELECTRODE (KITS) ×1
KIT POSITION SURG JACKSON T1 (MISCELLANEOUS) ×3 IMPLANT
KIT ROOM TURNOVER OR (KITS) ×3 IMPLANT
KIT XLIF (KITS) ×1
MARKER SKIN DUAL TIP RULER LAB (MISCELLANEOUS) ×6 IMPLANT
MIX DBX 10CC 35% BONE (Bone Implant) ×3 IMPLANT
MIX DBX 20CC MTF (Putty) ×3 IMPLANT
NEEDLE 22X1 1/2 (OR ONLY) (NEEDLE) ×3 IMPLANT
NEEDLE BONE MARROW 8GX6 FENEST (NEEDLE) IMPLANT
NEEDLE HYPO 25GX1X1/2 BEV (NEEDLE) ×6 IMPLANT
NEEDLE JAMSHIDI VIPER (NEEDLE) ×6 IMPLANT
NEEDLE SPNL 18GX3.5 QUINCKE PK (NEEDLE) ×6 IMPLANT
NS IRRIG 1000ML POUR BTL (IV SOLUTION) ×3 IMPLANT
OIL CARTRIDGE MAESTRO DRILL (MISCELLANEOUS)
PACK LAMINECTOMY ORTHO (CUSTOM PROCEDURE TRAY) ×3 IMPLANT
PACK UNIVERSAL I (CUSTOM PROCEDURE TRAY) ×6 IMPLANT
PAD ARMBOARD 7.5X6 YLW CONV (MISCELLANEOUS) ×6 IMPLANT
PATTIES SURGICAL .5 X1 (DISPOSABLE) ×3 IMPLANT
PATTIES SURGICAL .5 X3 (DISPOSABLE) ×3 IMPLANT
PATTIES SURGICAL .5X1.5 (GAUZE/BANDAGES/DRESSINGS) IMPLANT
PATTIES SURGICAL .75X.75 (GAUZE/BANDAGES/DRESSINGS) IMPLANT
ROD PRE LORD THORAC 5.5X85 (Rod) ×4 IMPLANT
ROD PREBENT VIPER MIS 85MM (Rod) ×2 IMPLANT
SCREW POLY XTAB 5X40MM (Screw) ×6 IMPLANT
SCREW SET SINGLE INNER MIS (Screw) ×24 IMPLANT
SCREW XTAB POLY VIPER  6X45 (Screw) ×4 IMPLANT
SCREW XTAB POLY VIPER  7X45 (Screw) ×2 IMPLANT
SCREW XTAB POLY VIPER 6X45 (Screw) ×8 IMPLANT
SCREW XTAB POLY VIPER 7X45 (Screw) ×4 IMPLANT
SPONGE GAUZE 4X4 12PLY (GAUZE/BANDAGES/DRESSINGS) ×3 IMPLANT
SPONGE INTESTINAL PEANUT (DISPOSABLE) ×9 IMPLANT
SPONGE LAP 4X18 X RAY DECT (DISPOSABLE) ×3 IMPLANT
SPONGE SURGIFOAM ABS GEL 100 (HEMOSTASIS) ×3 IMPLANT
STAPLER VISISTAT 35W (STAPLE) ×3 IMPLANT
STRIP CLOSURE SKIN 1/2X4 (GAUZE/BANDAGES/DRESSINGS) IMPLANT
SURGIFLO TRUKIT (HEMOSTASIS) IMPLANT
SUT MNCRL AB 3-0 PS2 18 (SUTURE) ×6 IMPLANT
SUT MNCRL AB 4-0 PS2 18 (SUTURE) ×6 IMPLANT
SUT PROLENE 5 0 C 1 24 (SUTURE) IMPLANT
SUT PROLENE 6 0 C 1 24 (SUTURE) IMPLANT
SUT SILK 2 0 TIES 10X30 (SUTURE) ×3 IMPLANT
SUT SILK 3 0 TIES 10X30 (SUTURE) ×3 IMPLANT
SUT VIC AB 0 CT1 18XCR BRD 8 (SUTURE) ×4 IMPLANT
SUT VIC AB 0 CT1 8-18 (SUTURE) ×2
SUT VIC AB 1 CT1 18XCR BRD 8 (SUTURE) ×4 IMPLANT
SUT VIC AB 1 CT1 27 (SUTURE) ×2
SUT VIC AB 1 CT1 27XBRD ANBCTR (SUTURE) ×4 IMPLANT
SUT VIC AB 1 CT1 8-18 (SUTURE) ×2
SUT VIC AB 1 CTX 36 (SUTURE) ×2
SUT VIC AB 1 CTX36XBRD ANBCTR (SUTURE) ×4 IMPLANT
SUT VIC AB 2-0 CT2 18 VCP726D (SUTURE) ×15 IMPLANT
SYR 20CC LL (SYRINGE) ×3 IMPLANT
SYR BULB IRRIGATION 50ML (SYRINGE) ×3 IMPLANT
SYR CONTROL 10ML LL (SYRINGE) ×3 IMPLANT
SYR TB 1ML LUER SLIP (SYRINGE) IMPLANT
TAP CANN VIPER2 DL 5.0 (TAP) ×3 IMPLANT
TAP CANN VIPER2 DL 6.0 (TAP) ×3 IMPLANT
TAPE CLOTH SURG 4X10 WHT LF (GAUZE/BANDAGES/DRESSINGS) ×6 IMPLANT
TAPE CLOTH SURG 6X10 WHT LF (GAUZE/BANDAGES/DRESSINGS) IMPLANT
TOWEL OR 17X24 6PK STRL BLUE (TOWEL DISPOSABLE) ×3 IMPLANT
TOWEL OR 17X26 10 PK STRL BLUE (TOWEL DISPOSABLE) ×3 IMPLANT
TRAY FOLEY CATH 14FRSI W/METER (CATHETERS) IMPLANT
TRAY FOLEY CATH 16FRSI W/METER (SET/KITS/TRAYS/PACK) ×3 IMPLANT
WATER STERILE IRR 1000ML POUR (IV SOLUTION) IMPLANT
YANKAUER SUCT BULB TIP NO VENT (SUCTIONS) ×3 IMPLANT

## 2013-05-24 NOTE — Anesthesia Procedure Notes (Signed)
Procedure Name: Intubation Date/Time: 05/24/2013 8:36 AM Performed by: Reine Just Pre-anesthesia Checklist: Patient identified, Emergency Drugs available, Suction available, Patient being monitored and Timeout performed Patient Re-evaluated:Patient Re-evaluated prior to inductionOxygen Delivery Method: Circle system utilized and Simple face mask Preoxygenation: Pre-oxygenation with 100% oxygen Intubation Type: IV induction Ventilation: Mask ventilation without difficulty Laryngoscope Size: Miller and 2 Grade View: Grade I Tube type: Oral Tube size: 7.5 mm Number of attempts: 1 Airway Equipment and Method: Patient positioned with wedge pillow and Stylet Placement Confirmation: ETT inserted through vocal cords under direct vision,  positive ETCO2 and breath sounds checked- equal and bilateral Secured at: 22 cm Tube secured with: Tape Dental Injury: Teeth and Oropharynx as per pre-operative assessment

## 2013-05-24 NOTE — Anesthesia Postprocedure Evaluation (Signed)
Anesthesia Post Note  Patient: Vicki Liu  Procedure(s) Performed: Procedure(s) (LRB): ANTERIOR LATERAL LUMBAR FUSION 3 LEVELS (Left) POSTERIOR LUMBAR FUSION 3 LEVEL (N/A)  Anesthesia type: general  Patient location: PACU  Post pain: Pain level controlled  Post assessment: Patient's Cardiovascular Status Stable  Last Vitals:  Filed Vitals:   05/24/13 2130  BP:   Pulse: 103  Temp:   Resp: 8    Post vital signs: Reviewed and stable  Level of consciousness: sedated  Complications: Pt oversedated needing narcan in PACU and assisted ventilation by mask until PACO2 decreased and Pt responsive.

## 2013-05-24 NOTE — Transfer of Care (Signed)
Immediate Anesthesia Transfer of Care Note  Patient: Vicki Liu  Procedure(s) Performed: Procedure(s) with comments: ANTERIOR LATERAL LUMBAR FUSION 3 LEVELS (Left) - Left sided lumbar 2-3, lumbar 3-4, lumbar 4-5 with allograft POSTERIOR LUMBAR FUSION 3 LEVEL (N/A) - Lumbar 2-3,lumbar 3-4, lumbar 4-5 posterior spinal fusion with instrumentation.  Patient Location: PACU  Anesthesia Type:General  Level of Consciousness: sedated  Airway & Oxygen Therapy: Patient Spontanous Breathing and Patient connected to nasal cannula oxygen  Post-op Assessment: Report given to PACU RN, Post -op Vital signs reviewed and stable and Patient moving all extremities  Post vital signs: Reviewed and stable  Complications: No apparent anesthesia complications

## 2013-05-24 NOTE — Progress Notes (Addendum)
Called to PACU with Pt apneic and unconscious, being ventilated with an Ambu bag. Vital signs were otherwise OK. Pt had received 27 cc fentanyl and Dilaudid 1mg  during the anesthetic. Was sleepy but extubated in PACU. I gace her 0.2mg  IV of Narcan with immediate increase in wakefulness. ABg showed PACO2 at this point to be 96. I assisted her ventilation with mask and Ambu and a ABG taken 20 minutes later showed her PACO2 to be 64, she was awake and responding and breathing well. She began to hurt at this point and I gave her small increments of fentanyl with good effect. She will spend the night in [place]. I discussed this with Dr Yevette Edwards.  Arta Bruce MD  Addendum: Diagnosis - Hypoventilation secondary to narcotic medications Disposition - Overnight monitoring in Step Down ICU  KO MD

## 2013-05-24 NOTE — Preoperative (Signed)
Beta Blockers   Reason not to administer Beta Blockers:Not Applicable 

## 2013-05-24 NOTE — H&P (Signed)
PREOPERATIVE H&P  Chief Complaint: Bilateral leg pain, LBP  HPI: Vicki Liu is a 67 y.o. female who presents with ongoing low back pain and bilateral leg pain. MRI = multilevel spinal stenosis L2-L5. Radiographs reveal degenerative scoliosis, Patient has failed multiple forms of conservative care and continues to have ongoing pain.  Past Medical History  Diagnosis Date  . Depression   . History of kidney stones   . GERD (gastroesophageal reflux disease)   . H/O hiatal hernia   . Arthritis    Past Surgical History  Procedure Laterality Date  . Hernia repair  12    hiatal  . Gastric restriction surgery  6/12    partial gastrectomy  . Abdominal hysterectomy    . Tonsillectomy      adenoids  . Tubal ligation     History   Social History  . Marital Status: Married    Spouse Name: N/A    Number of Children: N/A  . Years of Education: N/A   Social History Main Topics  . Smoking status: Current Every Day Smoker -- 0.50 packs/day for 10 years    Types: Cigarettes  . Smokeless tobacco: None  . Alcohol Use: No  . Drug Use: No  . Sexual Activity: None   Other Topics Concern  . None   Social History Narrative  . None   History reviewed. No pertinent family history. Allergies  Allergen Reactions  . Codeine Nausea And Vomiting and Other (See Comments)    Gastritis  . Celecoxib Other (See Comments)    Restlessness and high blood pressure   . Levaquin [Levofloxacin] Other (See Comments)    Redness all over arms  . Nsaids Other (See Comments)    gastritis   Prior to Admission medications   Medication Sig Start Date End Date Taking? Authorizing Provider  aspirin EC 81 MG tablet Take 81 mg by mouth daily.   Yes Historical Provider, MD  CALCIUM-VITAMIN D PO Take 3 tablets by mouth 2 (two) times daily. 1000 units of vitamin D + 1000mg  of calcium   Yes Historical Provider, MD  escitalopram (LEXAPRO) 20 MG tablet Take 20 mg by mouth daily.   Yes Historical Provider, MD   HYDROcodone-acetaminophen (NORCO/VICODIN) 5-325 MG per tablet Take 1 tablet by mouth every 4 (four) hours as needed for moderate pain.   Yes Historical Provider, MD  meclizine (ANTIVERT) 25 MG tablet Take 25 mg by mouth at bedtime.   Yes Historical Provider, MD  Multiple Vitamins-Minerals (MULTIVITAMIN WITH MINERALS) tablet Take 1 tablet by mouth 3 (three) times daily.   Yes Historical Provider, MD  omeprazole (PRILOSEC) 20 MG capsule Take 20 mg by mouth daily.   Yes Historical Provider, MD  zolpidem (AMBIEN) 10 MG tablet Take 10 mg by mouth at bedtime as needed for sleep.   Yes Historical Provider, MD  Cyanocobalamin (VITAMIN B-12 IJ) Inject 1 Applicatorful as directed See admin instructions. Every other month    Historical Provider, MD     All other systems have been reviewed and were otherwise negative with the exception of those mentioned in the HPI and as above.  Physical Exam: Filed Vitals:   05/24/13 0702  BP: 128/74  Pulse: 66  Temp: 97.1 F (36.2 C)  Resp: 20    General: Alert, no acute distress Cardiovascular: No pedal edema Respiratory: No cyanosis, no use of accessory musculature Skin: No lesions in the area of chief complaint Neurologic: Sensation intact distally Psychiatric: Patient is competent for consent with  normal mood and affect Lymphatic: No axillary or cervical lymphadenopathy   Assessment/Plan: Low back pain and bilateral leg pain Plan for Procedure(s): ANTERIOR LATERAL LUMBAR FUSION 3 LEVELS (L2-5) POSTERIOR LUMBAR FUSION 3 LEVEL   Emilee Hero, MD 05/24/2013 8:11 AM

## 2013-05-24 NOTE — Anesthesia Preprocedure Evaluation (Addendum)
Anesthesia Evaluation  Patient identified by MRN, date of birth, ID band Patient awake    Reviewed: Allergy & Precautions, H&P , NPO status , Patient's Chart, lab work & pertinent test results, reviewed documented beta blocker date and time   History of Anesthesia Complications (+) PONV  Airway Mallampati: II TM Distance: >3 FB Neck ROM: Full    Dental  (+) Teeth Intact   Pulmonary Current Smoker,  breath sounds clear to auscultation        Cardiovascular negative cardio ROS  Rhythm:Regular Rate:Normal     Neuro/Psych    GI/Hepatic Neg liver ROS, hiatal hernia, GERD-  Medicated and Controlled,  Endo/Other  negative endocrine ROS  Renal/GU negative Renal ROS     Musculoskeletal   Abdominal   Peds  Hematology negative hematology ROS (+)   Anesthesia Other Findings   Reproductive/Obstetrics                         Anesthesia Physical Anesthesia Plan  ASA: II  Anesthesia Plan: General   Post-op Pain Management:    Induction: Intravenous  Airway Management Planned: Oral ETT  Additional Equipment: Arterial line  Intra-op Plan:   Post-operative Plan: Extubation in OR  Informed Consent: I have reviewed the patients History and Physical, chart, labs and discussed the procedure including the risks, benefits and alternatives for the proposed anesthesia with the patient or authorized representative who has indicated his/her understanding and acceptance.   Dental advisory given  Plan Discussed with: CRNA, Anesthesiologist and Surgeon  Anesthesia Plan Comments:        Anesthesia Quick Evaluation

## 2013-05-25 LAB — MRSA PCR SCREENING: MRSA by PCR: NEGATIVE

## 2013-05-25 MED ORDER — DIAZEPAM 2 MG PO TABS
2.0000 mg | ORAL_TABLET | Freq: Four times a day (QID) | ORAL | Status: DC | PRN
  Administered 2013-05-25 – 2013-05-26 (×5): 2 mg via ORAL
  Filled 2013-05-25 (×5): qty 1

## 2013-05-25 MED ORDER — WHITE PETROLATUM GEL
Status: AC
  Administered 2013-05-25: 04:00:00
  Filled 2013-05-25: qty 5

## 2013-05-25 NOTE — Op Note (Signed)
Vicki Liu, ROTHSCHILD NO.:  [id number]  MEDICAL RECORD NO.:  [id number]  LOCATION:  3S07C                        FACILITY:  [facility]  PHYSICIAN:  Estill Bamberg, MD      DATE OF BIRTH:  [birth year]  DATE OF PROCEDURE:  05/24/2013                              OPERATIVE REPORT   PREOPERATIVE DIAGNOSES: 1. Lumbar spinal stenosis. 2. Degenerative lumbar scoliosis.  POSTOPERATIVE DIAGNOSES: 1. Lumbar spinal stenosis. 2. Degenerative lumbar scoliosis.  PROCEDURE: 1. Anterior lumbar interbody fusion via a direct lateral left-sided     approach L2-3, L3-4, L4-5. 2. Insertion of interbody device x3 (NuVasive interbody spacers). 3. Use of morselized allograft. 4. Posterior spinal fusion, L2-3, L3-4, L4-5. 5. Placement of posterior segmental instrumentation, L2, L3, L4, L5. 6. Intraoperative use of fluoroscopy.  SURGEON:  Estill Bamberg, MD  ASSISTANT:  Jason Coop, PA-C  ANESTHESIA:  General endotracheal anesthesia.  COMPLICATIONS:  None.  DISPOSITION:  Stable.  ESTIMATED BLOOD LOSS:  100 mL.  INDICATIONS FOR PROCEDURE:  Briefly, Vicki Liu is a very pleasant 67- year-old female who I have been following for bilateral leg pain as well as back pain.  The patient did have multiple forms of conservative care including epidural injections, physical therapy, and medications.  She did gone to have ongoing pain and dysfunction.  Given her degenerative curve and her symptoms associated with spinal stenosis, we did discuss proceeding with surgery as reflected above.  The patient did fully understand the risks and limitations of the procedure as outlined in my preoperative note.  Of note, the patient did understand that the patient's bilateral leg pain will likely be addressed with surgery, however, resolution of her back pain would be less predictable.  OPERATIVE DETAILS:  On May 24, 2013, the patient was brought to surgery and general endotracheal anesthesia  was administered.  The patient was placed in the lateral decubitus position with the left side up.  An axillary roll was placed on the patient's right axilla.  All bony prominences were meticulously padded.  The hips and knees were appropriately flexed, so as to decrease tension on the psoas muscle on the left.  The region of the left leg was prepped and draped in usual sterile fashion.  Of note, I did use neurologic monitoring throughout the surgery.  EMG leads were placed by me.  I then marked out the intervertebral space of L2-3, L3-4 as well as L4-5.  I did make an incision between the L3-4 and L4-5 interspaces over the lateral aspect of the disk space.  I then dissected through the external and internal oblique musculature, as well as the transversalis fascia muscle.  The transversalis fascia was then entered using a Metzenbaum.  The deep peroneal structures were bluntly swept anteriorly.  The retroperitoneal space was readily noted, as was the psoas.  I then place the initial dilator over the L4-5 interspace.  I did use lateral intraoperative fluoroscopy to optimize location of the initial dilator placement.  I did use triggered EMG while advancing the initial dilator.  I then sequentially dilated a second and then a third time, using triggered EMG each time.  The EMG testing did confirm there was  no neurologic structures immediately surrounding the region of the dilators.  A self- retaining retractor was then placed and anchored to the bed.  I then gently opened the dilator.  A posterior shin blade was used.  The intervertebral space was noted.  A knife was used to perform an annulotomy.  A thorough diskectomy was performed from the left to the right anulus.  A contralateral annular release was performed using the Cobb.  I did use a series of curettes and rasps to perform a thorough and complete diskectomy and a thorough endplate preparation.  I then advanced a series of trials and  the appropriate interbody spacer was packed with DBX mix and tamped into position in the usual fashion.  I was very pleased with the press fit of the implant, I was very pleased with the AP and lateral fluoroscopic appearance.  I then removed all the dilators then I did explore the wound for any undue bleeding, and none was encountered.  In the manner previously described, I did place an initial dilator overlying the L2-3 interspace.  I again sequentially dilated and placed a self-retaining retractor.  Once again, as previously discussed, a diskectomy was performed.  Of note, the approach to the L2-3 interspace was between the eleventh and twelfth rib.  This was well below the diaphragm.  Again, a contralateral annular release was performed.  I was very pleased with the diskectomy I was able to accomplish.  I then again prepared the endplates and placed a series of trials, and the appropriate size interbody spacer was packed with DBX mix and tamped into position in the usual fashion.  The retractor was then removed.  The wound was copiously irrigated, and the fascia was closed using 0 Vicryl.  The subcutaneous layer was closed using 2-0 Vicryl followed by 3-0 Monocryl.  Utilizing the initial incision, I then docked over the L3-4 interspace as previously documented.  Once again, a self-retaining retractor was placed.  I again performed a thorough and complete diskectomy as previously noted.  A contralateral annular release was performed.  I then placed a series of interbody spacer and the appropriate size interbody spacer was packed with DBX mix and tamped into position.  Of note, I did note correction of the patient's previous degenerative lumbar curve.  I was very pleased with the press fit of each of the implants.  This incision was then copiously irrigated.  The fascia was then closed using 0 Vicryl.  The subcutaneous layer was closed using 2-0 Vicryl, and the skin was closed using 3-0  Monocryl.  A sterile dressing was applied.  The patient was then placed prone on a well-padded flat Jackson bed with spinal frame.  Paramedian incisions were utilized just lateral to the lateral border of the pedicles from L2- L5.  I did use Jamshidi needles to cannulate the L2, L3, L4, as well as the L5 pedicles.  I did advance taps over the pedicles.  The taps were tested using triggered EMG and there was no tap tested below 8 milliamps.  I then placed a series of screws over the taps.  At L2, 5 x 40 mm screws were used, 6 x 45 mm screws were used at L3 and L4, and 7 x 45 mm screws were used at L5.  Of note, prior to placing the screws on the left, I was able to expose the posterolateral gutter.  I did use a high-speed burr to decorticate the transverse processes of L2, L3, L4,  and L5.  DBX mix was packed into the posterolateral gutter, in order to perform a posterolateral fusion.  Again, screws were then placed.  I then placed a 85-mm rods bilaterally.  Caps were secured to the pedicle screws to secure the rods.  A final locking procedure was performed.  Of note, I did liberally use AP and lateral fluoroscopy while placing the pedicle screws.  I was very pleased with the final appearance of the pedicle screws.  I then copiously irrigated the wound.  The fascia was closed using #1 Vicryl.  The subcutaneous layer was closed using 2-0 Vicryl.  The skin was closed using 3-0 Monocryl bilaterally.  Benzoin and Steri-Strips were applied followed by sterile dressing.  All instrument counts were correct at the termination of the procedure.  Of note, Jason Coop was my assistant throughout the entirety of the procedure, and did aid in essential retraction and suctioning needed throughout the entirety of the surgery.     Estill Bamberg, MD     MD/MEDQ  D:  05/24/2013  T:  05/25/2013  Job:  [id number]

## 2013-05-25 NOTE — Evaluation (Addendum)
Physical Therapy Evaluation Patient Details Name: Vicki Liu MRN: [id number] DOB: October 07, 1945 Today's Date: 05/25/2013 Time: 4132-4401 PT Time Calculation (min): 40 min  PT Assessment / Plan / Recommendation History of Present Illness  Pt admit Anterior and posterior fusion (ALIF L2-5 and PLIF L2-5).  Clinical Impression  Pt admitted with above. Pt currently with functional limitations due to the deficits listed below (see PT Problem List). Pt will need HHPT, RW and 3N1.   Pt will benefit from skilled PT to increase their independence and safety with mobility to allow discharge to the venue listed below.     PT Assessment  Patient needs continued PT services    Follow Up Recommendations  Supervision/Assistance - 24 hour;Home health PT                Equipment Recommendations  Rolling walker with 5" wheels;3in1 (PT)         Frequency Min 6X/week    Precautions / Restrictions Precautions Precautions: Fall;Back Required Braces or Orthoses: Spinal Brace Spinal Brace: Applied in supine position;Thoracolumbosacral orthotic Restrictions Weight Bearing Restrictions: No   Pertinent Vitals/Pain VSS, on 2LO2, 10/10 pain with PCA which pt used 3-4 x during treatment.  Also had valium per nurse prior to PT arrival.        Mobility  Bed Mobility Bed Mobility: Rolling Right;Rolling Left;Left Sidelying to Sit;Sitting - Scoot to [place] of Bed;Sit to Sidelying Left;Scooting to [facility] Rolling Right: 4: Min assist;With rail Rolling Left: 4: Min assist;With rail Left Sidelying to Sit: 3: Mod assist;With rails;HOB flat Sitting - Scoot to Edge of Bed: 4: Min assist Sit to Sidelying Left: 3: Mod assist;HOB flat;With rail Scooting to [facility]: 1: +1 Total assist Details for Bed Mobility Assistance: Pt needed cues for log roll.  Needed asssit for trunk and LEs off and onto bed.  Total assist to don brace in supine.   Transfers Transfers: Sit to Stand;Stand to Sit;Stand Pivot Transfers Sit to  Stand: 3: Mod assist;From elevated surface;With upper extremity assist;From bed Stand to Sit: 3: Mod assist;With upper extremity assist;With armrests;To chair/3-in-1 Stand Pivot Transfers: 3: Mod assist Details for Transfer Assistance: Pt needed cues for hand placement.  Pt needed assist for power up.  Pt leans posteriorly significantly needing incr asssit to lean forward and steady pt during entire pivot transfer.  Pt assisted to 3N1 and then back to bed per nursing request as pt sat up earlier and is to be transferred 5N later today.   Ambulation/Gait Ambulation/Gait Assistance: Not tested (comment) Stairs: No Wheelchair Mobility Wheelchair Mobility: No         PT Diagnosis: Generalized weakness;Acute pain  PT Problem List: Decreased activity tolerance;Decreased balance;Decreased mobility;Decreased knowledge of use of DME;Decreased safety awareness;Decreased knowledge of precautions;Pain PT Treatment Interventions: Gait training;DME instruction;Functional mobility training;Therapeutic activities;Therapeutic exercise;Balance training;Patient/family education     PT Goals(Current goals can be found in the care plan section) Acute Rehab PT Goals Patient Stated Goal: to go home PT Goal Formulation: With patient Time For Goal Achievement: 06/01/13 Potential to Achieve Goals: Good  Visit Information  Last PT Received On: 05/25/13 Assistance Needed: +2 History of Present Illness: Pt admit Anterior and posterior fusion (PLIF level 3).       Prior Functioning  Home Living Family/patient expects to be discharged to:: Private residence Living Arrangements: Spouse/significant other Available Help at Discharge: Family;Available 24 hours/day Type of Home: House Home Access: Stairs to enter [employer] of Steps: 2 Entrance Stairs-Rails: None Home Layout: One level Home  Equipment: None Additional Comments: husband and daughters will assist pt at d/c. Prior Function Level of  Independence: Independent Communication Communication: No difficulties    Cognition  Cognition Arousal/Alertness: Lethargic;Suspect due to medications Behavior During Therapy: Flat affect Overall Cognitive Status: Difficult to assess Difficult to assess due to: Level of arousal    Extremity/Trunk Assessment Upper Extremity Assessment Upper Extremity Assessment: Defer to OT evaluation Lower Extremity Assessment Lower Extremity Assessment: Generalized weakness   Balance Balance Balance Assessed: Yes Static Sitting Balance Static Sitting - Balance Support: Bilateral upper extremity supported;Feet supported Static Sitting - Level of Assistance: 4: Min assist Static Sitting - Comment/# of Minutes: 2 Static Standing Balance Static Standing - Balance Support: Bilateral upper extremity supported;During functional activity Static Standing - Level of Assistance: 3: Mod assist Static Standing - Comment/# of Minutes: 2 min standing with steadying assist to clean bottom after urinating on 3N1.    End of Session PT - End of Session Equipment Utilized During Treatment: Gait belt;Oxygen;Back brace Activity Tolerance: Patient limited by fatigue;Patient limited by lethargy;Patient limited by pain Patient left: in bed;with call bell/phone within reach;with family/visitor present Nurse Communication: Mobility status;Precautions       INGOLD,Tyra Michelle 05/25/2013, 3:07 PM [facility] Acute Rehabilitation [phone] (pager)

## 2013-05-25 NOTE — Progress Notes (Signed)
PCA Initiated at 0324 with Nedra Hai, RN.

## 2013-05-25 NOTE — Progress Notes (Signed)
Patient has been transferred to 5n bed 13 via bed. Phone report has been called to [employer]. Patient and family members are aware of the transfer.

## 2013-05-25 NOTE — Progress Notes (Addendum)
Subjective: 1 Day Post-Op Procedure(s) (LRB): ANTERIOR LATERAL LUMBAR FUSION 3 LEVELS (Left) POSTERIOR LUMBAR FUSION 3 LEVEL (N/A) Patient reports pain as 7 on 0-10 scale.   Pt alert. Up to chair. Foley out this am with TLSO in place, Denies leg pain/numbness. No Flatus yet. Taking po fluids.  Objective: Vital signs in last 24 hours: Temp:  [97.5 F (36.4 C)-98.9 F (37.2 C)] 98.9 F (37.2 C) (11/27 0800) Pulse Rate:  [84-127] 86 (11/27 0900) Resp:  [0-23] 12 (11/27 0900) BP: (109-199)/(45-122) 112/49 mmHg (11/27 0900) SpO2:  [33 %-99 %] 99 % (11/27 0900) Arterial Line BP: (118-227)/(47-102) 160/80 mmHg (11/27 0900) Weight:  [68.3 kg (150 lb 9.2 oz)-72.1 kg (158 lb 15.2 oz)] 72.1 kg (158 lb 15.2 oz) (11/26 2158)  Intake/Output from previous day: 11/26 0701 - 11/27 0700 In: 5675 [I.V.:4975; IV Piggyback:300] Out: 1500 [Urine:1300; Blood:200] Intake/Output this shift: Total I/O In: -  Out: 250 [Urine:250]   Recent Labs  05/24/13 1244 05/24/13 1858 05/24/13 1913  HGB 11.9* 12.9 12.9    Recent Labs  05/24/13 1858 05/24/13 1913  HCT 38.0 38.0    Recent Labs  05/24/13 1244 05/24/13 1858 05/24/13 1913  NA 143 139 141  K 3.8 4.1 4.2  GLUCOSE 94  --   --    Up in chair. TLSO brace in place. Good motor strength in legs. Nl sensation in legs. Neurovascular intact Sensation intact distally Intact pulses distally Dorsiflexion/Plantar flexion intact Compartment soft in calves  Assessment/Plan: 1 Day Post-Op Procedure(s) (LRB): ANTERIOR LATERAL LUMBAR FUSION 3 LEVELS (Left) POSTERIOR LUMBAR FUSION 3 LEVEL (N/A) Plan: Early mobilization with brace on. Slowly progress diet. Keep at clear liquids initially. D/C arterial line. Cont SCD's. Will  Transfer to 5 [place] today when stable. Probable discharge home in 2-3 days.Possibly Sat.   Travin Marik G 05/25/2013, 9:32 AM

## 2013-05-25 NOTE — Progress Notes (Signed)
From stepdown per bed alert oriented.DDi I lt side and back no c/o numbness or tingling. 98.8 89 18 sat 98% on 2 l bp 102/59

## 2013-05-26 MED ORDER — OMEPRAZOLE 20 MG PO CPDR
40.0000 mg | DELAYED_RELEASE_CAPSULE | Freq: Every day | ORAL | Status: DC
Start: 2013-05-26 — End: 2013-05-27
  Administered 2013-05-26 – 2013-05-27 (×2): 40 mg via ORAL
  Filled 2013-05-26 (×3): qty 2

## 2013-05-26 MED ORDER — METHOCARBAMOL 100 MG/ML IJ SOLN
500.0000 mg | Freq: Four times a day (QID) | INTRAVENOUS | Status: DC | PRN
  Filled 2013-05-26: qty 5

## 2013-05-26 MED ORDER — METHOCARBAMOL 500 MG PO TABS
500.0000 mg | ORAL_TABLET | Freq: Four times a day (QID) | ORAL | Status: DC | PRN
  Administered 2013-05-26 – 2013-05-27 (×3): 500 mg via ORAL
  Filled 2013-05-26 (×3): qty 1

## 2013-05-26 MED ORDER — HYDROMORPHONE HCL 2 MG PO TABS
1.0000 mg | ORAL_TABLET | ORAL | Status: DC | PRN
  Administered 2013-05-27 (×4): 1 mg via ORAL
  Filled 2013-05-26 (×4): qty 1

## 2013-05-26 MED FILL — Sodium Chloride IV Soln 0.9%: INTRAVENOUS | Qty: 1000 | Status: AC

## 2013-05-26 MED FILL — Heparin Sodium (Porcine) Inj 1000 Unit/ML: INTRAMUSCULAR | Qty: 30 | Status: AC

## 2013-05-26 NOTE — Progress Notes (Signed)
Subjective: 2 Days Post-Op Procedure(s) (LRB): ANTERIOR LATERAL LUMBAR FUSION 3 LEVELS (Left) POSTERIOR LUMBAR FUSION 3 LEVEL (N/A) Patient reports pain as 6 on 0-10 scale.   Taking po/voiding ok. + flatus. Pt states protonix doesn't work and she would like omeprazole which she takes at home. Objective: Vital signs in last 24 hours: Temp:  [98.5 F (36.9 C)-99.7 F (37.6 C)] 99.7 F (37.6 C) (11/28 0511) Pulse Rate:  [84-99] 99 (11/28 0511) Resp:  [12-19] 15 (11/28 0800) BP: (101-162)/(48-78) 162/78 mmHg (11/28 0511) SpO2:  [97 %-99 %] 98 % (11/28 0800) FiO2 (%):  [98 %] 98 % (11/27 1233)  Intake/Output from previous day: 11/27 0701 - 11/28 0700 In: 3675 [P.O.:1800; I.V.:1875] Out: 400 [Urine:400] Intake/Output this shift:     Recent Labs  05/24/13 1244 05/24/13 1858 05/24/13 1913  HGB 11.9* 12.9 12.9    Recent Labs  05/24/13 1858 05/24/13 1913  HCT 38.0 38.0    Recent Labs  05/24/13 1244 05/24/13 1858 05/24/13 1913  NA 143 139 141  K 3.8 4.1 4.2  GLUCOSE 94  --   --    TLSO brace in place. 5/5 motor strength in bilat legs. Nl sensation in LE's. Pt appears comfortable up in chair.  Assessment/Plan: 2 Days Post-Op Procedure(s) (LRB): ANTERIOR LATERAL LUMBAR FUSION 3 LEVELS (Left) POSTERIOR LUMBAR FUSION 3 LEVEL (N/A) Plan: Minimize po valium and use IV robaxin prn spasm. Mobilize pt with PT. Cont SCD's D/C PCA Dilaudid convert to po dilaudid prn pain. Will plan discharge home in AM with HHPT. D/C protonix   Switch to omeprazole.  Niaja Stickley G 05/26/2013, 10:23 AM

## 2013-05-26 NOTE — Clinical Documentation Improvement (Signed)
THIS DOCUMENT IS NOT A PERMANENT PART OF THE MEDICAL RECORD  Please update your documentation with the medical record to reflect your response to this query. If you need help knowing how to do this please call [phone].  05/26/13  To Dr. Arta Bruce, MD  In a better effort to capture your patient's severity of illness, reflect appropriate length of stay and utilization of resources, a review of the patient medical record has revealed the following indicators.    Based on your clinical judgment, please clarify and document in a progress note and/or discharge summary the clinical condition associated with the following supporting information:  In responding to this query please exercise your independent judgment.  The fact that a query is asked, does not imply that any particular answer is desired or expected.  "Pt apneic and unconscious, being ventilated with an Ambu bag" "Was sleepy but extubated in PACU" "PACO2 to be 64"  please document a clinical condition  pertaining to event noted if appropriate.  Thank you    Possible Clinical Conditions?  Hypoventilation due to medications  Acute Respiratory Insufficiency following surgery or trauma  Other Condition  Cannot Clinically Determine      Risk Factors: s/p Anterior lumbar interbody fusion via a direct lateral left-sided approach L2-3, L3-4, L4-5 and Posterior spinal fusion, L2-3, L3-4, L4-5.        Treatment: Given "0.2mg  IV of Narcan "  Oxygen: [place] @ 4L s/p use of ambu bag                      You may use possible, probable, or suspect with inpatient documentation. possible, probable, suspected diagnoses MUST be documented at the time of discharge  Reviewed:Hypoventilation secondary to narcotic medications   Thank Arrie Eastern  Clinical Documentation Specialist: [phone] Health Information Management [place]

## 2013-05-26 NOTE — Progress Notes (Signed)
Physical Therapy Treatment Patient Details Name: Vicki Liu MRN: [id number] DOB: 08/03/1945 Today's Date: 05/26/2013 Time: 2725-3664 PT Time Calculation (min): 26 min  PT Assessment / Plan / Recommendation  History of Present Illness Pt admit Anterior and posterior fusion (PLIF level 3).   PT Comments   Pt progressing with mobility though it is effortful due to pain.  Pt states plans are possibly for her to d/c tomorrow.  Cont with current POC to maximize functional mobility prior to d/c.     Follow Up Recommendations  Supervision/Assistance - 24 hour;Home health PT     Does the patient have the potential to tolerate intense rehabilitation     Barriers to Discharge        Equipment Recommendations  Rolling walker with 5" wheels;3in1 (PT)    Recommendations for Other Services    Frequency Min 6X/week   Progress towards PT Goals Progress towards PT goals: Progressing toward goals  Plan Current plan remains appropriate    Precautions / Restrictions Precautions Precautions: Fall;Back Required Braces or Orthoses: Spinal Brace Spinal Brace: Thoracolumbosacral orthotic;Applied in sitting position Restrictions Weight Bearing Restrictions: No   Pertinent Vitals/Pain 8/10 back & LE's.  PCA.  Repositioned for comfort.      Mobility  Bed Mobility Bed Mobility: Sit to Supine Sit to Supine: 4: Min assist Details for Bed Mobility Assistance: cues for sequencing & log rolling technique.  (A) to lift LE's back into bed & to maintain back precautions with side>supine Transfers Transfers: Sit to Stand;Stand to Sit Sit to Stand: 4: Min assist;With upper extremity assist;With armrests;From chair/3-in-1 Stand to Sit: 4: Min assist;With upper extremity assist;To bed Details for Transfer Assistance: cues for hand placement & technique.   Ambulation/Gait Ambulation/Gait Assistance: 4: Min guard Ambulation Distance (Feet): 25 Feet Assistive device: Rolling walker Ambulation/Gait  Assistance Details: Pt with very small shuffling steps & guarded posture.   Gait Pattern: Decreased step length - right;Decreased step length - left;Shuffle Gait velocity: decreased Stairs: No Wheelchair Mobility Wheelchair Mobility: No      PT Goals (current goals can now be found in the care plan section) Acute Rehab PT Goals Patient Stated Goal: home maybe tomorrow PT Goal Formulation: With patient Time For Goal Achievement: 06/01/13 Potential to Achieve Goals: Good  Visit Information  Last PT Received On: 05/26/13 Assistance Needed: +1 History of Present Illness: Pt admit Anterior and posterior fusion (PLIF level 3).    Subjective Data  Patient Stated Goal: home maybe tomorrow   Cognition  Cognition Arousal/Alertness: Awake/alert Behavior During Therapy: WFL for tasks assessed/performed Overall Cognitive Status: Within Functional Limits for tasks assessed    Balance     End of Session PT - End of Session Equipment Utilized During Treatment: Back brace Activity Tolerance: Patient tolerated treatment well;Patient limited by pain Patient left: in bed;with call bell/phone within reach Nurse Communication: Mobility status   GP     Lara Mulch 05/26/2013, 2:26 PM   Verdell Face, PTA [phone] 05/26/2013

## 2013-05-26 NOTE — Progress Notes (Signed)
Patient looks well. Minimal-moderate LBP. No leg pain.  Had a bad experience with a CNA last night, but otherwise has ben happy with her care.  BP 162/78  Pulse 99  Temp(Src) 99.7 F (37.6 C) (Oral)  Resp 15  Ht 5\' 7"  (1.702 m)  Wt 72.1 kg (158 lb 15.2 oz)  BMI 24.89 kg/m2  SpO2 98%  Patient appears sedated NVI  Dressing C/D/I  POD #2 after L2-5 A/P fusion  - patient doing well, plan to d/c home tomorrow with HHPT - continue dilaudid and valium/robaxin - will adjust medications slightly to additionally attempt to decrease sedation (will ensure patient is discharged home with proper meds)

## 2013-05-26 NOTE — Evaluation (Signed)
Occupational Therapy Evaluation Patient Details Name: Vicki Liu MRN: [id number] DOB: Aug 07, 1945 Today's Date: 05/26/2013 Time: 6578-4696 OT Time Calculation (min): 49 min  OT Assessment / Plan / Recommendation History of present illness Pt admit Anterior and posterior fusion (PLIF level 3).   Clinical Impression   This 67 yo female admitted with above presents to acute OT with increased pain, TLSO, and back precautions all affecting pt's PLOF at Independent. Will benefit from acute OT with follow up HHOT and 3n1.    OT Assessment  Patient needs continued OT Services    Follow Up Recommendations  Home health OT;Supervision/Assistance - 24 hour       Equipment Recommendations  3 in 1 bedside comode       Frequency  Min 2X/week    Precautions / Restrictions Precautions Precautions: Fall;Back Required Braces or Orthoses: Spinal Brace Spinal Brace: Thoracolumbosacral orthotic;Applied in sitting position (can have off if just getting up to the bathroom and back to bed) Restrictions Weight Bearing Restrictions: No   Pertinent Vitals/Pain 3/10 at rest; 5/10 with movement; encouraged PCA    ADL  Eating/Feeding: Independent Where Assessed - Eating/Feeding: Bed level Grooming: Set up;Supervision/safety Where Assessed - Grooming: Unsupported sitting Upper Body Bathing: Set up;Supervision/safety Where Assessed - Upper Body Bathing: Unsupported sitting Lower Body Bathing: +1 Total assistance Where Assessed - Lower Body Bathing: Supported sit to stand Upper Body Dressing: Minimal assistance Where Assessed - Upper Body Dressing: Unsupported sitting Lower Body Dressing: +1 Total assistance Where Assessed - Lower Body Dressing: Supported sit to Pharmacist, hospital: Minimal assistance Toilet Transfer Method: Sit to Barista: Materials engineer and Hygiene: Minimal assistance Where Assessed - Engineer, mining  and Hygiene: Sit to stand from 3-in-1 or toilet Equipment Used: Back brace;Gait belt;Rolling walker Transfers/Ambulation Related to ADLs: Min A for all with RW ADL Comments: Daughter called into room so spoke with her about the pt can put her brace on while sitting and can have it off just to go to bathroom and back to bed, also went over back precautions with daughter and made an family education appointment with her and pt's husband for tomorrow AM at 9:00.    OT Diagnosis: Generalized weakness;Acute pain  OT Problem List: Decreased range of motion;Impaired balance (sitting and/or standing);Pain;Decreased knowledge of precautions;Decreased knowledge of use of DME or AE OT Treatment Interventions: Self-care/ADL training;Balance training;Therapeutic activities;DME and/or AE instruction;Patient/family education   OT Goals(Current goals can be found in the care plan section) Acute Rehab OT Goals Patient Stated Goal: home maybe tomorrow OT Goal Formulation: With patient Time For Goal Achievement: 06/02/13 Potential to Achieve Goals: Good  Visit Information  Last OT Received On: 05/26/13 Assistance Needed: +1 History of Present Illness: Pt admit Anterior and posterior fusion (PLIF level 3).       Prior Functioning     Home Living Family/patient expects to be discharged to:: Private residence Living Arrangements: Spouse/significant other Available Help at Discharge: Family;Available 24 hours/day Type of Home: House Home Access: Stairs to enter [employer] of Steps: 2 Entrance Stairs-Rails: None Home Layout: One level Home Equipment: None Additional Comments: husband and daughter and sons will assist pt at d/c. Prior Function Level of Independence: Independent Communication Communication: No difficulties Dominant Hand: Right         Vision/Perception Vision - History Baseline Vision: Wears glasses all the time Patient Visual Report: No change from baseline    Cognition  Cognition Arousal/Alertness: Lethargic;Suspect due to medications  Behavior During Therapy: Flat affect Overall Cognitive Status: Within Functional Limits for tasks assessed Memory: Decreased recall of precautions    Extremity/Trunk Assessment Upper Extremity Assessment Upper Extremity Assessment: Overall WFL for tasks assessed     Mobility Bed Mobility Bed Mobility: Rolling Right;Right Sidelying to Sit;Sitting - Scoot to Edge of Bed Rolling Right: 3: Mod assist;With rail Right Sidelying to Sit: 4: Min assist;HOB flat;With rails Sitting - Scoot to Edge of Bed: 4: Min assist;With rail Details for Bed Mobility Assistance: VCs for sequencing Transfers Transfers: Sit to Stand;Stand to Sit Sit to Stand: 4: Min assist;With upper extremity assist;From bed Stand to Sit: 4: Min assist;With upper extremity assist;With armrests;To chair/3-in-1 Details for Transfer Assistance: VCs for safe hand placement           End of Session OT - End of Session Equipment Utilized During Treatment: Gait belt;Rolling walker;Back brace Activity Tolerance: Patient limited by lethargy Patient left: in chair;with call bell/phone within reach Nurse Communication: Mobility status       Evette Georges [phone] 05/26/2013, 10:15 AM

## 2013-05-26 NOTE — Progress Notes (Signed)
05/26/13 Spoke with patient about HHC. She chose [facility]. Contacted Madysun Thall with [facility] and set up HHPT and HHOT.Contacted Justin at [facility] and requested rolling walker and 3N1 be delivered to patient's room before discharge. Patient states that her husband will be able to assist her after d/c. Jacquelynn Cree RN, BSN, CCM

## 2013-05-26 NOTE — Clinical Documentation Improvement (Signed)
THIS DOCUMENT IS NOT A PERMANENT PART OF THE MEDICAL RECORD  Please update your documentation with the medical record to reflect your response to this query. If you need help knowing how to do this please call [phone].  05/26/13  To  Dr. Estill Bamberg  In a better effort to capture your patient's severity of illness, reflect appropriate length of stay and utilization of resources, a review of the patient medical record has revealed the following indicators.    Based on your clinical judgment, please clarify and document in a progress note and/or discharge summary the clinical condition associated with the following supporting information:  In responding to this query please exercise your independent judgment.  The fact that a query is asked, does not imply that any particular answer is desired or expected.  "Pt apneic and unconscious, being ventilated with an Ambu bag" "Was sleepy but extubated in PACU" "PACO2 to be 64" " She will spend the night in Stepdown. I discussed this with Dr Yevette Edwards" please document a clinical condition causing stay in Step down if appropriate.  Thank you    Possible Clinical Conditions?  Hypoventilation with respiratory distress due to medications  Acute Respiratory Insufficiency following surgery or trauma  Other Condition  Cannot Clinically Determine      Risk Factors: s/p Anterior lumbar interbody fusion via a direct lateral left-sided approach L2-3, L3-4, L4-5 and Posterior spinal fusion, L2-3, L3-4, L4-5.     Treatment: Given "0.2mg  IV of Narcan "  Oxygen: [place] @ 4L s/p use of ambu bag    You may use possible, probable, or suspect with inpatient documentation. possible, probable, suspected diagnoses MUST be documented at the time of discharge  Reviewed: hypoventilation with respiratory distress, likely due to the narcotics administered to her intraoperatively. Per note by Dr. Yevette Edwards 06/01/13   Thank You,  Leonette Most Jagar Lua  Clinical  Documentation Specialist: [phone] Health Information Management [place]

## 2013-05-26 NOTE — Progress Notes (Signed)
UR completed 

## 2013-05-27 MED ORDER — PROMETHAZINE HCL 12.5 MG PO TABS: 12.5000 mg | ORAL_TABLET | Freq: Four times a day (QID) | ORAL | Status: DC | PRN

## 2013-05-27 MED ORDER — HYDROMORPHONE HCL 2 MG PO TABS: 1.0000 mg | ORAL_TABLET | ORAL | Status: DC | PRN

## 2013-05-27 MED ORDER — PROMETHAZINE HCL 12.5 MG PO TABS: 50.0000 mg | ORAL_TABLET | Freq: Four times a day (QID) | ORAL | Status: DC | PRN

## 2013-05-27 MED ORDER — METHOCARBAMOL 500 MG PO TABS: 500.0000 mg | ORAL_TABLET | Freq: Four times a day (QID) | ORAL | Status: DC | PRN

## 2013-05-27 NOTE — Progress Notes (Signed)
Subjective: 3 Days Post-Op Procedure(s) (LRB): ANTERIOR LATERAL LUMBAR FUSION 3 LEVELS (Left) POSTERIOR LUMBAR FUSION 3 LEVEL (N/A) Patient reports pain as 4 on 0-10 scale.   Progressing with PT.  Denies leg sx's. Moderate LBP Some nausea when up.  Objective: Vital signs in last 24 hours: Temp:  [98.5 F (36.9 C)-99.2 F (37.3 C)] 99.2 F (37.3 C) (11/29 0606) Pulse Rate:  [90-94] 90 (11/29 0606) Resp:  [16] 16 (11/29 0606) BP: (130-160)/(56-79) 160/79 mmHg (11/29 0606) SpO2:  [95 %-96 %] 96 % (11/29 0606)  Intake/Output from previous day: 11/28 0701 - 11/29 0700 In: 480 [P.O.:480] Out: -  Intake/Output this shift: Total I/O In: 120 [P.O.:120] Out: -    Recent Labs  05/24/13 1244 05/24/13 1858 05/24/13 1913  HGB 11.9* 12.9 12.9    Recent Labs  05/24/13 1858 05/24/13 1913  HCT 38.0 38.0    Recent Labs  05/24/13 1244 05/24/13 1858 05/24/13 1913  NA 143 139 141  K 3.8 4.1 4.2  GLUCOSE 94  --   --    All dressings clean and dry. No drainage. Pt appears comfortable. Bilat calves soft and non tender. Good motor strength in legs.   Assessment/Plan: 3 Days Post-Op Procedure(s) (LRB): ANTERIOR LATERAL LUMBAR FUSION 3 LEVELS (Left) POSTERIOR LUMBAR FUSION 3 LEVEL (N/A) Plan: Discharge home with HHPT. Will give rx for Phenergan 12.5 mg prn nausea on discharge.  Kona Yusuf G 05/27/2013, 8:40 AM

## 2013-05-27 NOTE — Progress Notes (Signed)
Occupational Therapy Treatment Patient Details Name: Vicki Liu MRN: [id number] DOB: Sep 01, 1945 Today's Date: 05/27/2013 Time: 4098-1191 OT Time Calculation (min): 15 min  OT Assessment / Plan / Recommendation  History of present illness Pt admit Anterior and posterior fusion (PLIF level 3).   OT comments  Education completed. Pt ready for D/C. All OT can be addressed in next setting.  Follow Up Recommendations  Home health OT;Supervision/Assistance - 24 hour    Barriers to Discharge       Equipment Recommendations  3 in 1 bedside comode    Recommendations for Other Services    Frequency Min 2X/week   Progress towards OT Goals Progress towards OT goals: Progressing toward goals  Plan Discharge plan remains appropriate    Precautions / Restrictions Precautions Precautions: Fall;Back Precaution Booklet Issued: Yes (comment) Precaution Comments: Reviewed back precuations with family Required Braces or Orthoses: Spinal Brace Spinal Brace: Thoracolumbosacral orthotic;Applied in sitting position Restrictions Weight Bearing Restrictions: No   Pertinent Vitals/Pain C/o indigestion    ADL  Toilet Transfer: Hydrographic surveyor Method: Other (comment) (ambulating) Acupuncturist: Bedside commode Toileting - Clothing Manipulation and Hygiene: Supervision/safety;Other (comment) (Educated on use of tongs) Where Assessed - Engineer, mining and Hygiene: Sit to stand from 3-in-1 or toilet Tub/Shower Transfer: Min guard;Performed Tub/Shower Transfer Method: Science writer: Walk in shower (3 in 1) Equipment Used: Back brace;Gait belt;Reacher;Rolling walker;Sock aid;Long-handled sponge;Long-handled shoe horn Transfers/Ambulation Related to ADLs: min guard ADL Comments: Educated family on bed mobility and bed positioning, in addition to activity level and activity progression. Husband able to return demonstrate.    OT  Diagnosis:    OT Problem List:   OT Treatment Interventions:     OT Goals(current goals can now be found in the care plan section) Acute Rehab OT Goals Patient Stated Goal: home maybe tomorrow OT Goal Formulation: With patient Time For Goal Achievement: 06/02/13 Potential to Achieve Goals: Good ADL Goals Pt Will Perform Lower Body Bathing: with min assist;with adaptive equipment;sit to/from stand Pt Will Perform Lower Body Dressing: with min assist;with adaptive equipment;sit to/from stand Pt Will Transfer to Toilet: with supervision;ambulating;bedside commode Pt Will Perform Toileting - Clothing Manipulation and hygiene: with supervision;sit to/from stand Pt Will Perform Tub/Shower Transfer: Shower transfer;with supervision;3 in 1 Additional ADL Goal #1: Pt will be able to get in and OOB with S Additional ADL Goal #2: Pt's family will be independent in donning and doffing pt's TLSO while pt is seated  Visit Information  Last OT Received On: 05/27/13 Assistance Needed: +1 History of Present Illness: Pt admit Anterior and posterior fusion (PLIF level 3).    Subjective Data      Prior Functioning       Cognition  Cognition Arousal/Alertness: Awake/alert Behavior During Therapy: WFL for tasks assessed/performed Overall Cognitive Status: Within Functional Limits for tasks assessed    Mobility   Sit to Supine: Other (comment);4: Min assist (husband completing with pt. Husband doffing brace) Details for Bed Mobility Assistance: husband leading session. bed flat, no rails. (demonstrated proper bed positioning and sidelying for comfor) Transfers Transfers: Sit to Stand;Stand to Sit Sit to Stand: 4: Min guard;From chair/3-in-1 Stand to Sit: 5: Supervision;To bed Details for Transfer Assistance: good carry over of hand placement    Exercises      Balance  S   End of Session OT - End of Session Equipment Utilized During Treatment: Gait belt;Rolling walker;Back brace Activity  Tolerance: Patient tolerated treatment well Patient left: in  bed;with call bell/phone within reach;with family/visitor present Nurse Communication: Mobility status  GO     Ladashia Demarinis,HILLARY 05/27/2013, 12:17 PM [facility], OTR/L  [phone] 05/27/2013

## 2013-05-27 NOTE — Progress Notes (Signed)
   CARE MANAGEMENT NOTE 05/27/2013  Patient:  Vicki Liu,Vicki Liu   Account Number:  [id number]  Date Initiated:  05/26/2013  Documentation initiated by:  [facility]  Subjective/Objective Assessment:   admitted postop L 2-5 fusion     Action/Plan:   Pt/Ot evals-recommended HHPT and HHOT   Anticipated DC Date:  05/27/2013   Anticipated DC Plan:  HOME W HOME HEALTH SERVICES      DC Planning Services  CM consult      Choice offered to / List presented to:  C-1 Patient   DME arranged  3-N-1  Levan Hurst      DME agency  [facility]     HH arranged  HH-2 PT  HH-3 OT      [facility] agency  [facility]   Status of service:  Completed, signed off Medicare Important Message given?   (If response is "NO", the following Medicare IM given date fields will be blank) Date Medicare IM given:   Date Additional Medicare IM given:    Discharge Disposition:  HOME W HOME HEALTH SERVICES  Per UR Regulation:    If discussed at Long Length of Stay Meetings, dates discussed:    Comments:  05/27/13 15:00 CM spoke with pt and pt wishes to have her HH be [place].  Genevieve Norlander rep on campus and accepts referral. Sticky noted MD for F2F.  Called [facility] to make aware pt wishes to have [facility].  DME to be delivered to room prior to discharge.  No other CM needs were communicated.  Vicki Liu, BSN, [place] [phone].  05/26/13 Spoke with patient about HHC. She chose [facility]. Contacted Mary with [facility] and set up HHPT and HHOT.Contacted Justin at [facility] and requested rolling walker and 3N1 be delivered to patient's room before discharge. Patient states that her husband will be able to assist her after d/c. Vicki Cree RN, BSN, CCM

## 2013-05-27 NOTE — Progress Notes (Signed)
Notified on call MD concerning patient high blood pressure. Please note doc sheets for actual BPs. Dr. Luiz Blare called Dr. Yevette Edwards concerning patient's blood pressure. Dr. Yevette Edwards was okay with patient being discharged with her blood pressure being elevated. Also Dr. Yevette Edwards wanted patient to check her BP every 4 hours. Advised patient that if her BP continued to be elevated that she would need to follow up with her PCP on Monday. Also advised patient that if her diastolic is greater than 100 for her to call the on call MD. Patient's blood pressure prior to being discharged was 139/70. Patient understood and felt comfortable going home.

## 2013-05-27 NOTE — Progress Notes (Signed)
Physical Therapy Treatment Patient Details Name: Vicki Liu MRN: [id number] DOB: 11-02-1945 Today's Date: 05/27/2013 Time: 7829-5621 PT Time Calculation (min): 30 min  PT Assessment / Plan / Recommendation  History of Present Illness Pt admit Anterior and posterior fusion (PLIF level 3).   PT Comments   Pt making great progress today. Needed stair education for discharge home with family. Not in current plan of care. Discussed with PT coving floor today and verbal update for plan of care to include stair instruction obtained before session. Pt completed stair education without issues. Family (multiple) present for entire session and educated on mobility and safety for care of pt at home.    Follow Up Recommendations  Supervision/Assistance - 24 hour;Home health PT     Equipment Recommendations  Rolling walker with 5" wheels;3in1 (PT)    Frequency Min 6X/week   Progress towards PT Goals Progress towards PT goals: Progressing toward goals  Plan Current plan remains appropriate    Precautions / Restrictions Precautions Precautions: Fall;Back Required Braces or Orthoses: Spinal Brace Spinal Brace: Thoracolumbosacral orthotic;Applied in sitting position (mod assist-family educated)       Mobility  Bed Mobility Rolling Right: 4: Min guard Right Sidelying to Sit: 5: Supervision;HOB flat Sitting - Scoot to Edge of Bed: 5: Supervision Details for Bed Mobility Assistance: bed flat and no rails used. needed directional cues, however no physical assist needed with rolling or sitting up to edge of bed. family present and educated on log roll technique. Transfers Sit to Stand: 5: Supervision;From bed;From chair/3-in-1;With upper extremity assist;With armrests Stand to Sit: 5: Supervision;To chair/3-in-1;With upper extremity assist;With armrests Details for Transfer Assistance: cues for hand placement & technique.   Ambulation/Gait Ambulation/Gait Assistance: 4: Min guard;5:  Supervision Ambulation Distance (Feet): 80 Feet Assistive device: Rolling walker Ambulation/Gait Assistance Details: min cues for posture and walker position intitailly, no cues needed as gait progressed Gait Pattern: Step-through pattern;Decreased stride length;Shuffle Gait velocity: decreased Stairs: Yes Stairs Assistance: 4: Min assist Stairs Assistance Details (indicate cue type and reason): min HHA on left with ascending stairs, min guard assist with use of 1 rail to decend stairs. Stair Management Technique: One rail Left;Step to pattern;Forwards;Other (comment);No rails Number of Stairs: 2      PT Goals (current goals can now be found in the care plan section) Acute Rehab PT Goals Time For Goal Achievement: 06/01/13 Potential to Achieve Goals: Good  Visit Information  Last PT Received On: 05/27/13 Assistance Needed: +1 History of Present Illness: Pt admit Anterior and posterior fusion (PLIF level 3).       Cognition  Cognition Arousal/Alertness: Awake/alert Behavior During Therapy: WFL for tasks assessed/performed Overall Cognitive Status: Within Functional Limits for tasks assessed       End of Session PT - End of Session Equipment Utilized During Treatment: Gait belt;Back brace Activity Tolerance: Patient tolerated treatment well Patient left: in chair;with family/visitor present;Other (comment) (in rehab gym with OT.) Nurse Communication: Mobility status   GP     Sallyanne Kuster 05/27/2013, 12:08 PM  Sallyanne Kuster, PTA Office- [phone]

## 2013-05-27 NOTE — Progress Notes (Signed)
Agree with stair education. 05/27/2013 Corlis Hove, PT [phone]

## 2013-05-27 NOTE — Progress Notes (Signed)
Occupational Therapy Treatment Patient Details Name: Infinity Jeffords MRN: [id number] DOB: September 15, 1945 Today's Date: 05/27/2013 Time: 9562-1308 OT Time Calculation (min): 45 min  OT Assessment / Plan / Recommendation  History of present illness Pt admit Anterior and posterior fusion (PLIF level 3).   OT comments  Pt making good progress. Lethargy this am most likely due to pain meds. Educated family on donning/doffing brace, use of AE and DME for ADL, functional mobility @ RW level with toilet and shower transfers and home modifications/home safety.   Follow Up Recommendations  Home health OT;Supervision/Assistance - 24 hour    Barriers to Discharge       Equipment Recommendations  3 in 1 bedside comode    Recommendations for Other Services    Frequency Min 2X/week   Progress towards OT Goals Progress towards OT goals: Progressing toward goals  Plan Discharge plan remains appropriate    Precautions / Restrictions Precautions Precautions: Fall;Back Precaution Booklet Issued: Yes (comment) Precaution Comments: Reviewed back precuations with family Required Braces or Orthoses: Spinal Brace Spinal Brace: Thoracolumbosacral orthotic;Applied in sitting position Restrictions Weight Bearing Restrictions: No   Pertinent Vitals/Pain Pt c/o indigestion    ADL  Toilet Transfer: Min Pension scheme manager Method: Other (comment) (ambulating) Acupuncturist: Bedside commode Toileting - Clothing Manipulation and Hygiene: Supervision/safety;Other (comment) (Educated on use of tongs) Where Assessed - Engineer, mining and Hygiene: Sit to stand from 3-in-1 or toilet Tub/Shower Transfer: Min guard;Performed Tub/Shower Transfer Method: Science writer: Walk in shower (3 in 1) Equipment Used: Back brace;Gait belt;Reacher;Rolling walker;Sock aid;Long-handled sponge;Long-handled shoe horn Transfers/Ambulation Related to ADLs: min guard ADL  Comments: Educated family on back precautions, compensatory techniques and use of AE for ADL. Pt/family verbalized understanding. Pt's husband able to return demonstrate toilet and shower transfers with pt. Daughter plans to purchase AE for ADL. Family independent with donning/doffing TLSO    OT Diagnosis:    OT Problem List:   OT Treatment Interventions:     OT Goals(current goals can now be found in the care plan section) Acute Rehab OT Goals Patient Stated Goal: home maybe tomorrow OT Goal Formulation: With patient Time For Goal Achievement: 06/02/13 Potential to Achieve Goals: Good ADL Goals Pt Will Perform Lower Body Bathing: with min assist;with adaptive equipment;sit to/from stand Pt Will Perform Lower Body Dressing: with min assist;with adaptive equipment;sit to/from stand Pt Will Transfer to Toilet: with supervision;ambulating;bedside commode Pt Will Perform Toileting - Clothing Manipulation and hygiene: with supervision;sit to/from stand Pt Will Perform Tub/Shower Transfer: Shower transfer;with supervision;3 in 1 Additional ADL Goal #1: Pt will be able to get in and OOB with S Additional ADL Goal #2: Pt's family will be independent in donning and doffing pt's TLSO while pt is seated  Visit Information  Last OT Received On: 05/27/13 Assistance Needed: +1 History of Present Illness: Pt admit Anterior and posterior fusion (PLIF level 3).    Subjective Data      Prior Functioning       Cognition  Cognition Arousal/Alertness: Awake/alert Behavior During Therapy: WFL for tasks assessed/performed Overall Cognitive Status: Within Functional Limits for tasks assessed    Mobility  Bed Mobility Rolling Right: 4: Min guard Right Sidelying to Sit: 5: Supervision;HOB flat Sitting - Scoot to Edge of Bed: 5: Supervision Details for Bed Mobility Assistance: bed flat and no rails used. needed directional cues, however no physical assist needed with rolling or sitting up to edge of  bed. family present and educated on log roll  technique. Transfers Transfers: Sit to Stand;Stand to Sit Sit to Stand: 4: Min guard;From chair/3-in-1 Stand to Sit: 4: Min guard;To chair/3-in-1 Details for Transfer Assistance: good carry over of hand placement    Exercises      Balance  S   End of Session OT - End of Session Equipment Utilized During Treatment: Gait belt;Rolling walker;Back brace Activity Tolerance: Patient tolerated treatment well Patient left: in chair;with call bell/phone within reach;with family/visitor present Nurse Communication: Mobility status  GO     Jasher Barkan,HILLARY 05/27/2013, 12:11 PM [facility], OTR/L  [phone] 05/27/2013

## 2013-05-29 ENCOUNTER — Encounter (HOSPITAL_COMMUNITY): Payer: Self-pay | Admitting: Orthopedic Surgery

## 2013-06-01 NOTE — Progress Notes (Signed)
Upon my return to PACU after speaking with family, patient was noted to be apnic and sats went from 90s to 69s. I helped ventilate the patient with an ambu bag with PACU nurse. Patient sats then increased to normal levels. Narcan was given per Dr. Michelle Piper. Upon discussion with Dr. Michelle Piper, decision was made to transfer patient to stepdown and the patient ultimately did well and was discharged home 3 days later. The patient did suffer from hypoventilation with respiratory distress, likely due to the narcotics administered to her intraoperatively.

## 2013-06-06 NOTE — Discharge Summary (Signed)
Patient ID: Vicki Liu MRN: [id number] DOB/AGE: 67/10/1945 67 y.o.  Admit date: 05/24/2013 Discharge date: 05/27/2013  Admission Diagnoses:  Active Problems:   Radiculopathy   Spinal stenosis of lumbar region   Discharge Diagnoses:  Same  Past Medical History  Diagnosis Date  . Depression   . History of kidney stones   . GERD (gastroesophageal reflux disease)   . H/O hiatal hernia   . Arthritis     Surgeries: Procedure(s): ANTERIOR LATERAL LUMBAR FUSION 3 LEVELS L2-5 POSTERIOR LUMBAR FUSION 3 LEVEL L2-5 on 05/24/2013   Consultants: None Discharged Condition: Improved  Hospital Course: Vicki Liu is an 67 y.o. female who was admitted 05/24/2013 for operative treatment of spinal stenosis. Patient has severe unremitting pain that affects sleep, daily activities, and work/hobbies. After pre-op clearance the patient was taken to the operating room on 05/24/2013 and underwent  Procedure(s): ANTERIOR LATERAL LUMBAR FUSION 3 LEVELS L2-5  POSTERIOR LUMBAR FUSION 3 LEVEL L2-5.    Patient was given perioperative antibiotics:  Anti-infectives   Start     Dose/Rate Route Frequency Ordered Stop   05/24/13 2330  ceFAZolin (ANCEF) IVPB 1 g/50 mL premix     1 g 100 mL/hr over 30 Minutes Intravenous Every 8 hours 05/24/13 2223 05/25/13 0737   05/24/13 0600  ceFAZolin (ANCEF) IVPB 2 g/50 mL premix     2 g 100 mL/hr over 30 Minutes Intravenous On call to O.R. 05/23/13 1446 05/24/13 1310       Patient was given sequential compression devices, early ambulation to prevent DVT.  Patient benefited maximally from hospital stay and there was complication of hypoventilation and respiratory distress in PACU secondary to intraoperative narcotics. Narcan was administered per Dr. Michelle Piper and ambu bag ventilation restored sats. Pt was transferred to stepdown for first night and did very well with no residual difficulties or episodes.   Recent vital signs: BP 172/92  Pulse 94  Temp(Src) 98 F  (36.7 C) (Oral)  Resp 16  Ht 5\' 7"  (1.702 m)  Wt 72.1 kg (158 lb 15.2 oz)  BMI 24.89 kg/m2  SpO2 98%   Discharge Medications:     Medication List    STOP taking these medications       HYDROcodone-acetaminophen 5-325 MG per tablet  Commonly known as:  NORCO/VICODIN      TAKE these medications       aspirin EC 81 MG tablet  Take 81 mg by mouth daily.     CALCIUM-VITAMIN D PO  Take 3 tablets by mouth 2 (two) times daily. 1000 units of vitamin D + 1000mg  of calcium     escitalopram 20 MG tablet  Commonly known as:  LEXAPRO  Take 20 mg by mouth daily.     HYDROmorphone 2 MG tablet  Commonly known as:  DILAUDID  Take 0.5 tablets (1 mg total) by mouth every 4 (four) hours as needed for severe pain.     meclizine 25 MG tablet  Commonly known as:  ANTIVERT  Take 25 mg by mouth at bedtime.     methocarbamol 500 MG tablet  Commonly known as:  ROBAXIN  Take 1 tablet (500 mg total) by mouth every 6 (six) hours as needed for muscle spasms.     multivitamin with minerals tablet  Take 1 tablet by mouth 3 (three) times daily.     omeprazole 20 MG capsule  Commonly known as:  PRILOSEC  Take 20 mg by mouth daily.     promethazine 12.5 MG tablet  Commonly known as:  PHENERGAN  Take 1 tablet (12.5 mg total) by mouth every 6 (six) hours as needed for nausea.     VITAMIN B-12 IJ  Inject 1 Applicatorful as directed See admin instructions. Every other month     zolpidem 10 MG tablet  Commonly known as:  AMBIEN  Take 10 mg by mouth at bedtime as needed for sleep.        Diagnostic Studies: Dg Chest 2 View  05/17/2013   CLINICAL DATA:  Preop.  EXAM: CHEST  2 VIEW  COMPARISON:  None.  FINDINGS: The cardiomediastinal silhouette is within normal limits. The lungs are well inflated and clear. There is no evidence of pleural effusion or pneumothorax. No acute osseous abnormality is identified. Surgical clips and suture material are noted in the left upper quadrant. Surgical clips  are also noted in the lower neck.  IMPRESSION: Clear lungs.   Electronically Signed   By: Sebastian Ache   On: 05/17/2013 14:11   X-ray Lumbar Spine Ap And Lateral  05/17/2013   CLINICAL DATA:  Lumbar spondylosis.  EXAM: LUMBAR SPINE - 2-3 VIEW  COMPARISON:  Lumbar spine MRI 03/02/2013  FINDINGS: There is mild rotatory lumbar dextroscoliosis. Moderate L1 compression fracture is unchanged from prior MRI. Associated retropulsion is better evaluated on prior MRI. No listhesis is identified. There is mild disc space narrowing at L4-5. Anterior osteophytosis is noted at L1-2 and L2-3. There is mild facet arthrosis in the lower lumbar spine. There is no evidence of acute fracture. The bones appear osteopenic. Suture material is noted in the left upper quadrant.  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 No acute osseous abnormality identified.   Electronically Signed   By: Sebastian Ache   On: 05/17/2013 14:25   Dg Lumbar Spine Complete  05/24/2013   CLINICAL DATA:  L2-5 XLIF.  EXAM: LUMBAR SPINE - COMPLETE 4+ VIEW; DG C-ARM GT 120 MIN  COMPARISON:  Lumbar spine radiographs 05/17/2013  FINDINGS: Four spot fluoroscopic images of the lumbar spine are submitted from the operating room. These demonstrate the placement of XLIF devices at L2-3, L3-4 and L4-5. There are paired pedicle screws and interconnecting rods at each level. The hardware appears well positioned. No complications are identified.  IMPRESSION: Intraoperative views during L2-5 XLIF. No demonstrated complication.   Electronically Signed   By: Roxy Horseman M.D.   On: 05/24/2013 17:57   Dg C-arm Gt 120 Min  05/24/2013   CLINICAL DATA:  L2-5 XLIF.  EXAM: LUMBAR SPINE - COMPLETE 4+ VIEW; DG C-ARM GT 120 MIN  COMPARISON:  Lumbar spine radiographs 05/17/2013  FINDINGS: Four spot fluoroscopic images of the lumbar spine are submitted from the operating room. These demonstrate the placement of XLIF devices at L2-3, L3-4 and L4-5.  There are paired pedicle screws and interconnecting rods at each level. The hardware appears well positioned. No complications are identified.  IMPRESSION: Intraoperative views during L2-5 XLIF. No demonstrated complication.   Electronically Signed   By: Roxy Horseman M.D.   On: 05/24/2013 17:57    Disposition: 06-Home-Health Care Svc      Discharge Orders   Future Orders Complete By Expires   Call MD / Call 911  As directed    Comments:     If you experience chest pain or shortness of breath, CALL 911 and be transported to the hospital emergency room.  If you develope a fever above 101 F, pus (white drainage) or increased drainage or redness  at the wound, or calf pain, call your surgeon's office.   Constipation Prevention  As directed    Comments:     Drink plenty of fluids.  Prune juice may be helpful.  You may use a stool softener, such as Colace (over the counter) 100 mg twice a day.  Use MiraLax (over the counter) for constipation as needed.   Discharge instructions  As directed    Comments:     Wear back brace when out of bed   Increase activity slowly as tolerated  As directed       Follow-up Information   Follow up with Emilee Hero, MD On 06/07/2013.   Specialty:  Orthopedic Surgery   Contact information:   [address] [place] [address] [phone]       Follow up with [facility]. (home health physical therapay)    Contact information:   [address] [place] [address] [phone]     Assessment/Plan:  3 Days Post-Op   ANTERIOR LATERAL LUMBAR FUSION 3 LEVELS L2-5 (Left)  POSTERIOR LUMBAR FUSION 3 LEVEL, L2-5 Plan:  Discharge home with HHPT.  Will give rx for Phenergan 12.5 mg prn nausea on discharge.    SignedGeorga Bora 06/06/2013, 11:50 AM

## 2013-12-16 ENCOUNTER — Encounter (HOSPITAL_COMMUNITY): Payer: Self-pay | Admitting: Emergency Medicine

## 2013-12-16 ENCOUNTER — Emergency Department (HOSPITAL_COMMUNITY)
Admission: EM | Admit: 2013-12-16 | Discharge: 2013-12-16 | Disposition: A | Discharge status: Home / Self Care | Payer: Medicare Other | Attending: Emergency Medicine | Admitting: Emergency Medicine

## 2013-12-16 DIAGNOSIS — K219 Gastro-esophageal reflux disease without esophagitis: Secondary | ICD-10-CM | POA: Insufficient documentation

## 2013-12-16 DIAGNOSIS — Y929 Unspecified place or not applicable: Secondary | ICD-10-CM | POA: Insufficient documentation

## 2013-12-16 DIAGNOSIS — R209 Unspecified disturbances of skin sensation: Secondary | ICD-10-CM | POA: Insufficient documentation

## 2013-12-16 DIAGNOSIS — T84039A Mechanical loosening of unspecified internal prosthetic joint, initial encounter: Secondary | ICD-10-CM | POA: Insufficient documentation

## 2013-12-16 DIAGNOSIS — F17200 Nicotine dependence, unspecified, uncomplicated: Secondary | ICD-10-CM | POA: Insufficient documentation

## 2013-12-16 DIAGNOSIS — Y831 Surgical operation with implant of artificial internal device as the cause of abnormal reaction of the patient, or of later complication, without mention of misadventure at the time of the procedure: Secondary | ICD-10-CM | POA: Insufficient documentation

## 2013-12-16 DIAGNOSIS — F329 Major depressive disorder, single episode, unspecified: Secondary | ICD-10-CM | POA: Insufficient documentation

## 2013-12-16 DIAGNOSIS — Z79899 Other long term (current) drug therapy: Secondary | ICD-10-CM | POA: Insufficient documentation

## 2013-12-16 DIAGNOSIS — W1809XA Striking against other object with subsequent fall, initial encounter: Secondary | ICD-10-CM | POA: Insufficient documentation

## 2013-12-16 DIAGNOSIS — Z7982 Long term (current) use of aspirin: Secondary | ICD-10-CM | POA: Insufficient documentation

## 2013-12-16 DIAGNOSIS — Z87442 Personal history of urinary calculi: Secondary | ICD-10-CM | POA: Insufficient documentation

## 2013-12-16 DIAGNOSIS — T84498A Other mechanical complication of other internal orthopedic devices, implants and grafts, initial encounter: Secondary | ICD-10-CM

## 2013-12-16 DIAGNOSIS — M129 Arthropathy, unspecified: Secondary | ICD-10-CM | POA: Insufficient documentation

## 2013-12-16 DIAGNOSIS — Z981 Arthrodesis status: Secondary | ICD-10-CM | POA: Insufficient documentation

## 2013-12-16 DIAGNOSIS — F3289 Other specified depressive episodes: Secondary | ICD-10-CM | POA: Insufficient documentation

## 2013-12-16 DIAGNOSIS — Y9339 Activity, other involving climbing, rappelling and jumping off: Secondary | ICD-10-CM | POA: Insufficient documentation

## 2013-12-16 MED ORDER — HYDROCODONE-ACETAMINOPHEN 10-325 MG PO TABS: 1.0000 | ORAL_TABLET | Freq: Four times a day (QID) | ORAL | Status: DC | PRN

## 2013-12-16 NOTE — ED Provider Notes (Signed)
CSN: [id number]     Arrival date & time 12/16/13  1447 History   First MD Initiated Contact with Patient 12/16/13 1507     Chief Complaint  Patient presents with  . Back Pain     (Consider location/radiation/quality/duration/timing/severity/associated sxs/prior Treatment) HPI Comments: Pt went to [facility] today with imaging done showing concern for left pedicle screw at L2 loosening.  Pt states no back pain before fall last night  Patient is a 68 y.o. female presenting with back pain. The history is provided by the patient.  Back Pain Location:  Lumbar spine Quality:  Shooting, stabbing and aching Radiates to:  Does not radiate Pain severity:  Severe Pain is:  Same all the time Onset quality:  Sudden Duration:  12 hours Timing:  Constant Progression:  Unchanged Chronicity:  New Context comment:  Pt was in her carport last night and walked up and saw a snake causing her to jump back and fall hitting her tailbone and falling back on her back Relieved by: improved with norco and back brace. Worsened by:  Bending, ambulation, coughing, deep breathing and movement Associated symptoms: no abdominal pain, no bladder incontinence, no bowel incontinence, no dysuria, no headaches, no numbness, no paresthesias and no weakness   Associated symptoms comment:  No LOC Risk factors comment:  Prior hx of back surgery.   Past Medical History  Diagnosis Date  . Depression   . History of kidney stones   . GERD (gastroesophageal reflux disease)   . H/O hiatal hernia   . Arthritis    Past Surgical History  Procedure Laterality Date  . Hernia repair  12    hiatal  . Gastric restriction surgery  6/12    partial gastrectomy  . Abdominal hysterectomy    . Tonsillectomy      adenoids  . Tubal ligation    . Anterior lat lumbar fusion Left 05/24/2013    Procedure: ANTERIOR LATERAL LUMBAR FUSION 3 LEVELS;  Surgeon: Emilee HeroMark Leonard Dumonski, MD;  Location: [facility] OR;  Service: Orthopedics;  Laterality: Left;   Left sided lumbar 2-3, lumbar 3-4, lumbar 4-5 with allograft   History reviewed. No pertinent family history. History  Substance Use Topics  . Smoking status: Current Every Day Smoker -- 0.50 packs/day for 10 years    Types: Cigarettes  . Smokeless tobacco: Not on file  . Alcohol Use: No   OB History   Grav Para Term Preterm Abortions TAB SAB Ect Mult Living                 Review of Systems  Gastrointestinal: Negative for abdominal pain and bowel incontinence.  Genitourinary: Negative for bladder incontinence and dysuria.  Musculoskeletal: Positive for back pain.  Neurological: Negative for weakness, numbness, headaches and paresthesias.  All other systems reviewed and are negative.     Allergies  Codeine; Celecoxib; Levaquin; and Nsaids  Home Medications   Prior to Admission medications   Medication Sig Start Date End Date Taking? Authorizing Provider  aspirin EC 81 MG tablet Take 81 mg by mouth daily.    Historical Provider, MD  CALCIUM-VITAMIN D PO Take 3 tablets by mouth 2 (two) times daily. 1000 units of vitamin D + 1000mg  of calcium    Historical Provider, MD  cefdinir (OMNICEF) 300 MG capsule  12/04/13   Historical Provider, MD  chlorhexidine (PERIDEX) 0.12 % solution  11/05/13   Historical Provider, MD  Cyanocobalamin (VITAMIN B-12 IJ) Inject 1 Applicatorful as directed See admin instructions. Every  other month    Historical Provider, MD  escitalopram (LEXAPRO) 20 MG tablet Take 20 mg by mouth daily.    Historical Provider, MD  HYDROcodone-acetaminophen [facility]) 10-325 MG per tablet  11/28/13   Historical Provider, MD  HYDROmorphone (DILAUDID) 2 MG tablet Take 0.5 tablets (1 mg total) by mouth every 4 (four) hours as needed for severe pain. 05/27/13   Matthew Folks, PA-C  meclizine (ANTIVERT) 25 MG tablet Take 25 mg by mouth at bedtime.    Historical Provider, MD  methocarbamol (ROBAXIN) 500 MG tablet Take 1 tablet (500 mg total) by mouth every 6 (six) hours as needed  for muscle spasms. 05/27/13   Matthew Folks, PA-C  Multiple Vitamins-Minerals (MULTIVITAMIN WITH MINERALS) tablet Take 1 tablet by mouth 3 (three) times daily.    Historical Provider, MD  omeprazole (PRILOSEC) 20 MG capsule Take 20 mg by mouth daily.    Historical Provider, MD  promethazine (PHENERGAN) 12.5 MG tablet Take 1 tablet (12.5 mg total) by mouth every 6 (six) hours as needed for nausea. 05/27/13   Matthew Folks, PA-C  zolpidem (AMBIEN) 10 MG tablet Take 10 mg by mouth at bedtime as needed for sleep.    Historical Provider, MD   BP 99/49  Pulse 93  Temp(Src) 97.5 F (36.4 C) (Oral)  Resp 16  Ht 5\' 3"  (1.6 m)  Wt 145 lb (65.772 kg)  BMI 25.69 kg/m2  SpO2 98% Physical Exam  Nursing note and vitals reviewed. Constitutional: She is oriented to person, place, and time. She appears well-developed and well-nourished. She appears distressed.  Appears in pain  HENT:  Head: Normocephalic and atraumatic.  Mouth/Throat: Oropharynx is clear and moist.  Eyes: Conjunctivae and EOM are normal. Pupils are equal, round, and reactive to light.  Neck: Normal range of motion. Neck supple.  Cardiovascular: Normal rate, regular rhythm and intact distal pulses.   No murmur heard. Pulmonary/Chest: Effort normal and breath sounds normal. No respiratory distress. She has no wheezes. She has no rales.  Abdominal: Soft. She exhibits no distension. There is no tenderness. There is no rebound and no guarding.  Musculoskeletal: She exhibits tenderness. She exhibits no edema.       Lumbar back: She exhibits decreased range of motion, tenderness and bony tenderness. She exhibits no deformity.       Back:  Neurological: She is alert and oriented to person, place, and time. She has normal strength.  Decreased sensation to the left leg that is chronic  Skin: Skin is warm and dry. No rash noted. No erythema.  Psychiatric: She has a normal mood and affect. Her behavior is normal.    ED Course    Procedures (including critical care time) Labs Review Labs Reviewed - No data to display  Imaging Review No results found.   EKG Interpretation None      MDM   Final diagnoses:  Loosening of hardware in spine    Patient with a history of lumbar fusion and who presents today after a mechanical fall last night and persistent severe back pain that's new. Imaging done at her doctor's office this morning showed concern for left pedicle screw at L2 being loosened. This is consistent with where patient is having pain.  She denies any numbness or weakness in the legs that's new. No new bowel or bladder incontinence or retention. Patient has been taking hydrocodone at home with some improvement in pain and has been wearing her back brace that she had her  surgery.  Neurovascularly intact here.  Patient surgery was performed by Dr. Azucena Cecilomonski and will discuss with [facility] first for recommendations.  Pt does not feel she needs any further pain medication at this time.  3:26 PM Spoke with [facility] and they recommended wearing brace and pain control with f/u in the office next week. This does not need to be treated emergently.  Gwyneth SproutWhitney Plunkett, MD 12/16/13 1539

## 2013-12-16 NOTE — ED Notes (Addendum)
Pt was outside yesterday, saw a snake, and jumped backwards falling onto her back on gravel driveway. She has a history of back surgery. She had severe pain in her back through the night last night. THis am she put her old back brace on, took 2 norco and 1 robaxin and went to see her PCP, he did xrays in office and told pt to come to ed immediately "for a possible pelvic fracture and loose hardware in spine." she is ambulatory, cms intact all extremities. Denies any head injury. She has xrays with her

## 2014-04-10 ENCOUNTER — Other Ambulatory Visit: Payer: Self-pay | Admitting: Orthopedic Surgery

## 2014-04-10 ENCOUNTER — Encounter (HOSPITAL_COMMUNITY): Payer: Self-pay | Admitting: Pharmacy Technician

## 2014-04-16 ENCOUNTER — Encounter (HOSPITAL_COMMUNITY): Payer: Self-pay

## 2014-04-16 ENCOUNTER — Encounter (HOSPITAL_COMMUNITY)
Admission: RE | Admit: 2014-04-16 | Discharge: 2014-04-16 | Disposition: A | Discharge status: Home / Self Care | Payer: Medicare Other | Source: Ambulatory Visit | Attending: Orthopedic Surgery | Admitting: Orthopedic Surgery

## 2014-04-16 HISTORY — DX: Chronic kidney disease, unspecified: N18.9

## 2014-04-16 LAB — TYPE AND SCREEN
ABO/RH(D): O POS
Antibody Screen: NEGATIVE

## 2014-04-16 LAB — URINALYSIS, ROUTINE W REFLEX MICROSCOPIC
Bilirubin Urine: NEGATIVE
GLUCOSE, UA: NEGATIVE mg/dL
Hgb urine dipstick: NEGATIVE
KETONES UR: NEGATIVE mg/dL
Leukocytes, UA: NEGATIVE
Nitrite: NEGATIVE
PROTEIN: NEGATIVE mg/dL
Specific Gravity, Urine: 1.011 (ref 1.005–1.030)
Urobilinogen, UA: 0.2 mg/dL (ref 0.0–1.0)
pH: 5 (ref 5.0–8.0)

## 2014-04-16 LAB — CBC WITH DIFFERENTIAL/PLATELET
Basophils Absolute: 0 10*3/uL (ref 0.0–0.1)
Basophils Relative: 1 % (ref 0–1)
EOS ABS: 0.3 10*3/uL (ref 0.0–0.7)
Eosinophils Relative: 5 % (ref 0–5)
HCT: 42.4 % (ref 36.0–46.0)
HEMOGLOBIN: 14.3 g/dL (ref 12.0–15.0)
LYMPHS ABS: 2.2 10*3/uL (ref 0.7–4.0)
Lymphocytes Relative: 33 % (ref 12–46)
MCH: 32.4 pg (ref 26.0–34.0)
MCHC: 33.7 g/dL (ref 30.0–36.0)
MCV: 96.1 fL (ref 78.0–100.0)
MONOS PCT: 6 % (ref 3–12)
Monocytes Absolute: 0.4 10*3/uL (ref 0.1–1.0)
Neutro Abs: 3.7 10*3/uL (ref 1.7–7.7)
Neutrophils Relative %: 55 % (ref 43–77)
Platelets: 295 10*3/uL (ref 150–400)
RBC: 4.41 MIL/uL (ref 3.87–5.11)
RDW: 12.6 % (ref 11.5–15.5)
WBC: 6.5 10*3/uL (ref 4.0–10.5)

## 2014-04-16 LAB — COMPREHENSIVE METABOLIC PANEL
ALT: 15 U/L (ref 0–35)
ANION GAP: 11 (ref 5–15)
AST: 20 U/L (ref 0–37)
Albumin: 3.7 g/dL (ref 3.5–5.2)
Alkaline Phosphatase: 98 U/L (ref 39–117)
BUN: 12 mg/dL (ref 6–23)
CALCIUM: 9.4 mg/dL (ref 8.4–10.5)
CO2: 26 mEq/L (ref 19–32)
Chloride: 104 mEq/L (ref 96–112)
Creatinine, Ser: 0.86 mg/dL (ref 0.50–1.10)
GFR calc non Af Amer: 68 mL/min — ABNORMAL LOW (ref >90)
GFR, EST AFRICAN AMERICAN: 79 mL/min — AB (ref >90)
GLUCOSE: 80 mg/dL (ref 70–99)
Potassium: 3.9 mEq/L (ref 3.7–5.3)
Sodium: 141 mEq/L (ref 137–147)
TOTAL PROTEIN: 6.9 g/dL (ref 6.0–8.3)
Total Bilirubin: <0.2 mg/dL — ABNORMAL LOW (ref 0.3–1.2)

## 2014-04-16 LAB — SURGICAL PCR SCREEN
MRSA, PCR: NEGATIVE
Staphylococcus aureus: NEGATIVE

## 2014-04-16 LAB — PROTIME-INR
INR: 1.03 (ref 0.00–1.49)
Prothrombin Time: 13.6 seconds (ref 11.6–15.2)

## 2014-04-16 LAB — APTT: aPTT: 32 seconds (ref 24–37)

## 2014-04-16 NOTE — Pre-Procedure Instructions (Addendum)
Vicki Liu  04/16/2014   Your procedure is scheduled on:  Wednesday, October 21.   [facility] Ad[facility] at 6:30AM.  Call this number if you have problems the morning of surgery: [phone]   Remember:   Do not eat food or drink liquids after midnight Tuesday, October 20.   Take these medicines the morning of surgery with A SIP OF WATER: escitalopram (LEXAPRO), omeprazole (PRILOSEC).              Take if needed:HYDROcodone-acetaminophen (NORCO/VICODIN), meclizine (ANTIVERT).                 Stop taking Vitamins and Aspirin.   Do not wear jewelry, make-up or nail polish.  Do not wear lotions, powders, or perfumes.   Do not shave 48 hours prior to surgery.   Do not bring valuables to the hospital.             [facility] is not responsible  for any belongings or valuables.               Contacts, dentures or bridgework may not be worn into surgery.  Leave suitcase in the car. After surgery it may be brought to your room.  For patients admitted to the hospital, discharge time is determined by your treatment team.               Patients discharged the day of surgery will not be allowed to drive home.  Name and phone number of your driver: -   Special Instructions: [place] - Preparing for Surgery  Before surgery, you can play an important role.  Because skin is not sterile, your skin needs to be as free of germs as possible.  You can reduce the number of germs on you skin by washing with CHG (chlorahexidine gluconate) soap before surgery.  CHG is an antiseptic cleaner which kills germs and bonds with the skin to continue killing germs even after washing.  Please DO NOT use if you have an allergy to CHG or antibacterial soaps.  If your skin becomes reddened/irritated stop using the CHG and inform your nurse when you arrive at Short Stay.  Do not shave (including legs and underarms) for at least 48 hours prior to the first CHG shower.  You may shave your  face.  Please follow these instructions carefully:   1.  Shower with CHG Soap the night before surgery and the                                morning of Surgery.  2.  If you choose to wash your hair, wash your hair first as usual with your       normal shampoo.  3.  After you shampoo, rinse your hair and body thoroughly to remove the                      Shampoo.  4.  Use CHG as you would any other liquid soap.  You can apply chg directly       to the skin and wash gently with scrungie or a clean washcloth.  5.  Apply the CHG Soap to your body ONLY FROM THE NECK DOWN.        Do not use on open wounds or open sores.  Avoid contact with your eyes,       ears, mouth  and genitals (private parts).  Wash genitals (private parts)       with your normal soap.  6.  Wash thoroughly, paying special attention to the area where your surgery        will be performed.  7.  Thoroughly rinse your body with warm water from the neck down.  8.  DO NOT shower/wash with your normal soap after using and rinsing off       the CHG Soap.  9.  Pat yourself dry with a clean towel.            10.  Wear clean pajamas.            11.  Place clean sheets on your bed the night of your first shower and do not        sleep with pets.  Day of Surgery  Do not apply any lotions/deoderants the morning of surgery.  Please wear clean clothes to the hospital/surgery center.      Please read over the following fact sheets that you were given: Pain Booklet, Coughing and Deep Breathing, Blood Transfusion Information and Surgical Site Infection Prevention and Incentive Spirometry.

## 2014-04-17 MED ORDER — CEFAZOLIN SODIUM-DEXTROSE 2-3 GM-% IV SOLR
2.0000 g | INTRAVENOUS | Status: AC
  Administered 2014-04-18: 2 g via INTRAVENOUS
  Filled 2014-04-17: qty 50

## 2014-04-17 NOTE — Progress Notes (Signed)
Anesthesia Chart Review: Patient is a 68 year old female scheduled for T12 kyphoplasty on 04/18/14 by Dr. Yevette Edwardsumonski.  History includes smoking, GERD, hiatal hernia repair, nephrolithiasis, arthritis, depression, hysterectomy, bilateral carotid endarterectomy '08 (Dr. Tressa BusmanNester Cruz), gastric restriction surgery ("partial gastrectomy"), L2-5 ALIF 05/24/13.  PCP is Dr. Hope Pigeonebecca Everly [facility].  EKG sent from her PCP office is > 1 year ago (written date appears to read 01/20/11---this is also the EKG sent prior to her ALIF 04/2013 that is scanned under the Media tab). With history of carotid artery disease, she will need a more recent baseline preoperative EKG.    Carotid duplex on 07/25/12 (scanned under Media tab) showed: 40-59% right ICA stenosis, 1-39% left ICA stenosis, unchanged from 07/24/11.  CXR on 05/17/13 showed: Clear lungs.  Preoperative labs noted.   She tolerated a three level ALIF 11 months ago.  If her same day EKG is felt stable, and she remains asymptomatic from a CV standpoint then I would anticipate that she could proceed as planned.  Further evaluation by her assigned anesthesiologist on the day of surgery.  Velna Ochsllison Nazaret Chea, PA-C [facility]/Anesthesiology Phone [phone] 04/17/2014 10:27 AM

## 2014-04-18 ENCOUNTER — Encounter (HOSPITAL_COMMUNITY): Payer: Medicare Other | Admitting: Vascular Surgery

## 2014-04-18 ENCOUNTER — Encounter (HOSPITAL_COMMUNITY): Payer: Self-pay

## 2014-04-18 ENCOUNTER — Inpatient Hospital Stay (HOSPITAL_COMMUNITY): Payer: Medicare Other | Admitting: Anesthesiology

## 2014-04-18 ENCOUNTER — Inpatient Hospital Stay (HOSPITAL_COMMUNITY)
Admission: RE | Admit: 2014-04-18 | Discharge: 2014-04-19 | DRG: 517 | Disposition: A | Discharge status: Home / Self Care | Payer: Medicare Other | Source: Ambulatory Visit | Attending: Orthopedic Surgery | Admitting: Orthopedic Surgery

## 2014-04-18 ENCOUNTER — Inpatient Hospital Stay (HOSPITAL_COMMUNITY): Payer: Medicare Other

## 2014-04-18 ENCOUNTER — Encounter (HOSPITAL_COMMUNITY)
Admission: RE | Disposition: A | Discharge status: Home / Self Care | Payer: Self-pay | Source: Ambulatory Visit | Attending: Orthopedic Surgery

## 2014-04-18 DIAGNOSIS — Z79899 Other long term (current) drug therapy: Secondary | ICD-10-CM | POA: Diagnosis not present

## 2014-04-18 DIAGNOSIS — K219 Gastro-esophageal reflux disease without esophagitis: Secondary | ICD-10-CM | POA: Diagnosis present

## 2014-04-18 DIAGNOSIS — F17210 Nicotine dependence, cigarettes, uncomplicated: Secondary | ICD-10-CM | POA: Diagnosis present

## 2014-04-18 DIAGNOSIS — F329 Major depressive disorder, single episode, unspecified: Secondary | ICD-10-CM | POA: Diagnosis present

## 2014-04-18 DIAGNOSIS — M549 Dorsalgia, unspecified: Secondary | ICD-10-CM | POA: Diagnosis present

## 2014-04-18 DIAGNOSIS — M4854XA Collapsed vertebra, not elsewhere classified, thoracic region, initial encounter for fracture: Principal | ICD-10-CM | POA: Diagnosis present

## 2014-04-18 DIAGNOSIS — Z7982 Long term (current) use of aspirin: Secondary | ICD-10-CM | POA: Diagnosis not present

## 2014-04-18 DIAGNOSIS — Z01812 Encounter for preprocedural laboratory examination: Secondary | ICD-10-CM

## 2014-04-18 DIAGNOSIS — IMO0002 Reserved for concepts with insufficient information to code with codable children: Secondary | ICD-10-CM

## 2014-04-18 DIAGNOSIS — S22000A Wedge compression fracture of unspecified thoracic vertebra, initial encounter for closed fracture: Secondary | ICD-10-CM

## 2014-04-18 HISTORY — PX: KYPHOPLASTY: SHX5884

## 2014-04-18 IMAGING — RF DG THORACOLUMBAR SPINE 2V
1 series · 2 of 2 positions shown · non-contrast
Comparison: MRI [DATE]

CLINICAL DATA: T12 kyphoplasty

EXAM:
THORACOLUMBAR SPINE - 2 VIEW; DG C-ARM 61-120 MIN

[Series 1: run · 2 of 2 slices shown]
[im 1/2]
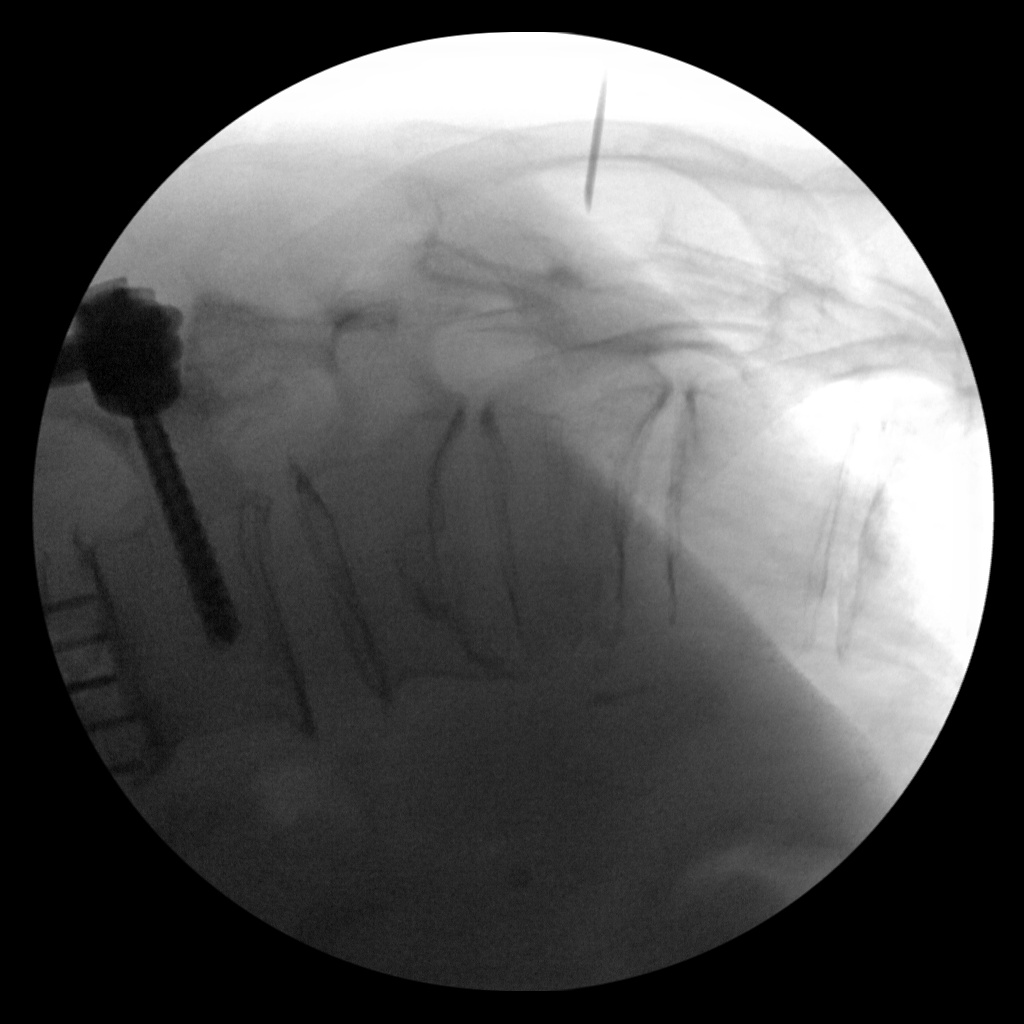
[im 2/2]
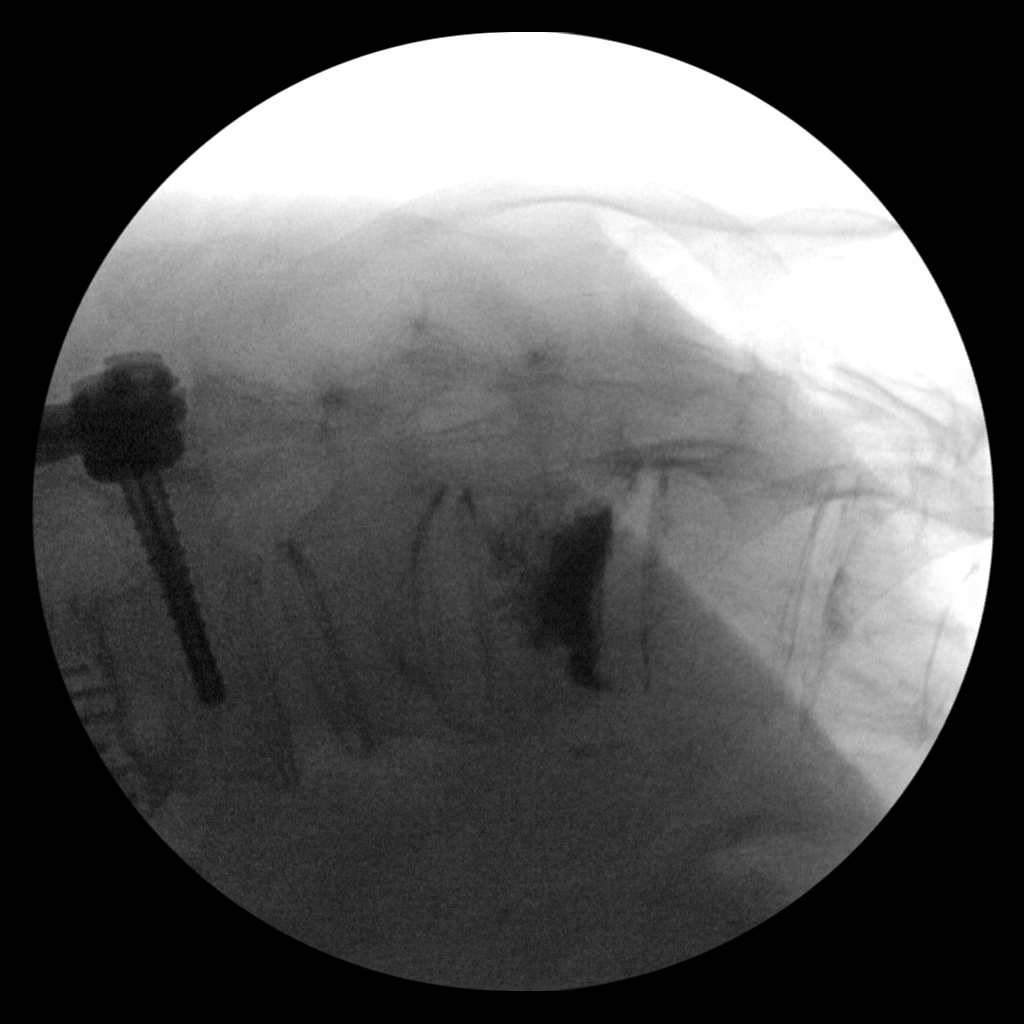

[2 of 2 positions shown; findings below may reference images not displayed]

FINDINGS: 4 images from portable intraoperative C-arm radiography shows
vertebral augmentation of the T12 vertebral compression fracture.
IMPRESSION: Status post T12 vertebroplasty.

## 2014-04-18 IMAGING — RF DG THORACOLUMBAR SPINE 2V
1 series · 2 of 2 positions shown · non-contrast
Comparison: MRI [DATE]

CLINICAL DATA: T12 kyphoplasty

EXAM:
THORACOLUMBAR SPINE - 2 VIEW; DG C-ARM 61-120 MIN

[Series 1: run · 2 of 2 slices shown]
[im 1/2]
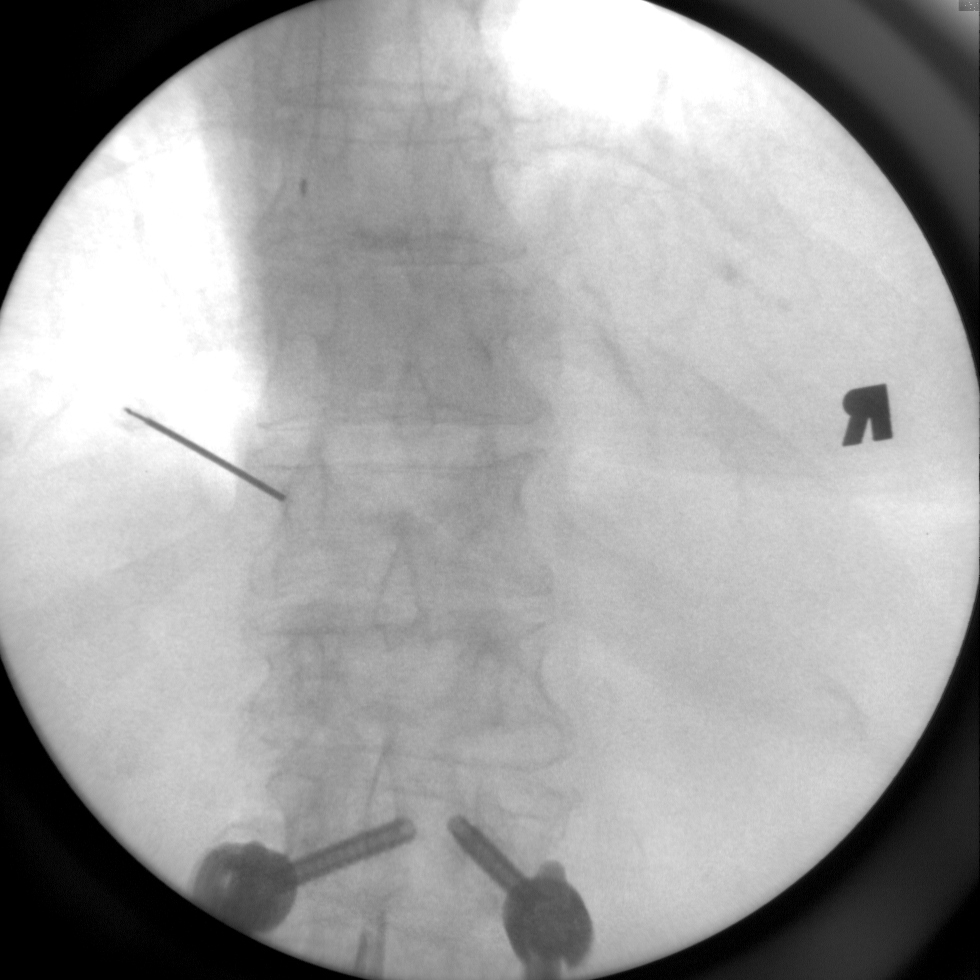
[im 2/2]
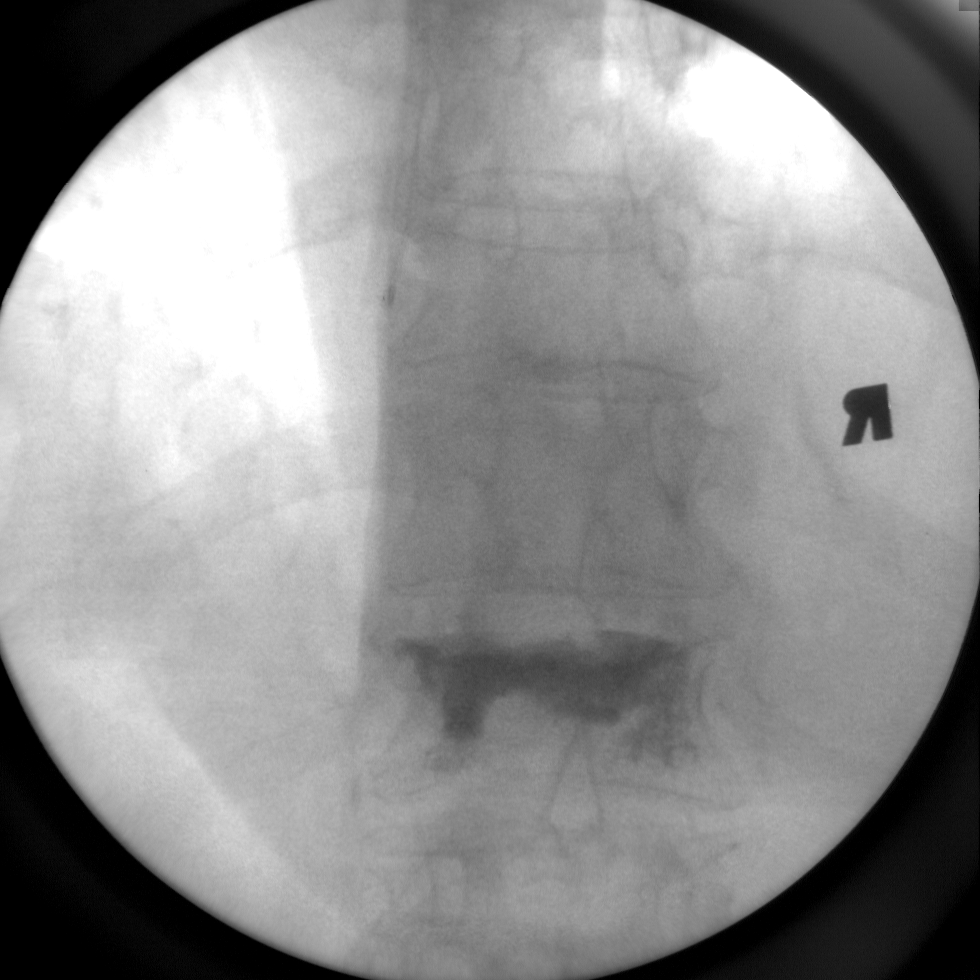

[2 of 2 positions shown; findings below may reference images not displayed]

FINDINGS: 4 images from portable intraoperative C-arm radiography shows
vertebral augmentation of the T12 vertebral compression fracture.
IMPRESSION: Status post T12 vertebroplasty.

## 2014-04-18 SURGERY — KYPHOPLASTY
Anesthesia: General | Site: Spine Thoracic

## 2014-04-18 MED ORDER — EPHEDRINE SULFATE 50 MG/ML IJ SOLN
INTRAMUSCULAR | Status: AC
  Filled 2014-04-18: qty 1

## 2014-04-18 MED ORDER — ONDANSETRON HCL 4 MG/2ML IJ SOLN
INTRAMUSCULAR | Status: DC | PRN
  Administered 2014-04-18: 4 mg via INTRAVENOUS

## 2014-04-18 MED ORDER — ZOLPIDEM TARTRATE 5 MG PO TABS: 10.0000 mg | ORAL_TABLET | Freq: Every evening | ORAL | Status: DC | PRN

## 2014-04-18 MED ORDER — PROPOFOL 10 MG/ML IV BOLUS
INTRAVENOUS | Status: DC | PRN
  Administered 2014-04-18: 150 mg via INTRAVENOUS

## 2014-04-18 MED ORDER — PANTOPRAZOLE SODIUM 40 MG PO TBEC
40.0000 mg | DELAYED_RELEASE_TABLET | Freq: Every day | ORAL | Status: DC
  Administered 2014-04-19: 40 mg via ORAL
  Filled 2014-04-18: qty 1

## 2014-04-18 MED ORDER — SODIUM CHLORIDE 0.9 % IJ SOLN: 3.0000 mL | INTRAMUSCULAR | Status: DC | PRN

## 2014-04-18 MED ORDER — ACETAMINOPHEN 325 MG PO TABS: 650.0000 mg | ORAL_TABLET | ORAL | Status: DC | PRN

## 2014-04-18 MED ORDER — OXYCODONE-ACETAMINOPHEN 5-325 MG PO TABS: 1.0000 | ORAL_TABLET | ORAL | Status: DC | PRN

## 2014-04-18 MED ORDER — SODIUM CHLORIDE 0.9 % IV SOLN: 250.0000 mL | INTRAVENOUS | Status: DC

## 2014-04-18 MED ORDER — MECLIZINE HCL 25 MG PO TABS
25.0000 mg | ORAL_TABLET | Freq: Two times a day (BID) | ORAL | Status: DC | PRN
  Filled 2014-04-18: qty 1

## 2014-04-18 MED ORDER — FENTANYL CITRATE 0.05 MG/ML IJ SOLN
INTRAMUSCULAR | Status: AC
  Filled 2014-04-18: qty 5

## 2014-04-18 MED ORDER — ONDANSETRON HCL 4 MG/2ML IJ SOLN
INTRAMUSCULAR | Status: AC
  Filled 2014-04-18: qty 2

## 2014-04-18 MED ORDER — PROPOFOL 10 MG/ML IV BOLUS
INTRAVENOUS | Status: AC
  Filled 2014-04-18: qty 20

## 2014-04-18 MED ORDER — BACITRACIN ZINC 500 UNIT/GM EX OINT
TOPICAL_OINTMENT | CUTANEOUS | Status: DC | PRN
  Administered 2014-04-18: 1 via TOPICAL

## 2014-04-18 MED ORDER — SODIUM CHLORIDE 0.9 % IJ SOLN: 3.0000 mL | Freq: Two times a day (BID) | INTRAMUSCULAR | Status: DC

## 2014-04-18 MED ORDER — FENTANYL CITRATE 0.05 MG/ML IJ SOLN
INTRAMUSCULAR | Status: DC | PRN
  Administered 2014-04-18: 100 ug via INTRAVENOUS

## 2014-04-18 MED ORDER — PROMETHAZINE HCL 25 MG/ML IJ SOLN
INTRAMUSCULAR | Status: AC
  Filled 2014-04-18: qty 1

## 2014-04-18 MED ORDER — NEOSTIGMINE METHYLSULFATE 10 MG/10ML IV SOLN
INTRAVENOUS | Status: AC
  Filled 2014-04-18: qty 1

## 2014-04-18 MED ORDER — ALUM & MAG HYDROXIDE-SIMETH 200-200-20 MG/5ML PO SUSP: 30.0000 mL | Freq: Four times a day (QID) | ORAL | Status: DC | PRN

## 2014-04-18 MED ORDER — LIDOCAINE HCL (CARDIAC) 20 MG/ML IV SOLN
INTRAVENOUS | Status: DC | PRN
  Administered 2014-04-18: 50 mg via INTRAVENOUS

## 2014-04-18 MED ORDER — ONDANSETRON HCL 4 MG/2ML IJ SOLN: 4.0000 mg | INTRAMUSCULAR | Status: DC | PRN

## 2014-04-18 MED ORDER — HYDROCODONE-ACETAMINOPHEN 5-325 MG PO TABS: 1.0000 | ORAL_TABLET | Freq: Four times a day (QID) | ORAL | Status: DC | PRN

## 2014-04-18 MED ORDER — BUPIVACAINE-EPINEPHRINE 0.25% -1:200000 IJ SOLN
INTRAMUSCULAR | Status: DC | PRN
  Administered 2014-04-18: 2 mL

## 2014-04-18 MED ORDER — VITAMIN D3 25 MCG (1000 UNIT) PO TABS
1000.0000 [IU] | ORAL_TABLET | Freq: Two times a day (BID) | ORAL | Status: DC
  Administered 2014-04-18 – 2014-04-19 (×2): 1000 [IU] via ORAL
  Filled 2014-04-18 (×3): qty 1

## 2014-04-18 MED ORDER — IOHEXOL 300 MG/ML  SOLN
INTRAMUSCULAR | Status: DC | PRN
  Administered 2014-04-18: 50 mL

## 2014-04-18 MED ORDER — GLYCOPYRROLATE 0.2 MG/ML IJ SOLN
INTRAMUSCULAR | Status: DC | PRN
  Administered 2014-04-18: .5 mg via INTRAVENOUS

## 2014-04-18 MED ORDER — ROCURONIUM BROMIDE 50 MG/5ML IV SOLN
INTRAVENOUS | Status: AC
  Filled 2014-04-18: qty 1

## 2014-04-18 MED ORDER — POVIDONE-IODINE 7.5 % EX SOLN
Freq: Once | CUTANEOUS | Status: DC
  Filled 2014-04-18: qty 118

## 2014-04-18 MED ORDER — BACITRACIN ZINC 500 UNIT/GM EX OINT
TOPICAL_OINTMENT | CUTANEOUS | Status: AC
  Filled 2014-04-18: qty 15

## 2014-04-18 MED ORDER — BUPIVACAINE-EPINEPHRINE (PF) 0.25% -1:200000 IJ SOLN
INTRAMUSCULAR | Status: AC
  Filled 2014-04-18: qty 30

## 2014-04-18 MED ORDER — LIDOCAINE HCL (CARDIAC) 20 MG/ML IV SOLN
INTRAVENOUS | Status: AC
  Filled 2014-04-18: qty 5

## 2014-04-18 MED ORDER — FLEET ENEMA 7-19 GM/118ML RE ENEM
1.0000 | ENEMA | Freq: Once | RECTAL | Status: AC | PRN
  Filled 2014-04-18: qty 1

## 2014-04-18 MED ORDER — SENNOSIDES-DOCUSATE SODIUM 8.6-50 MG PO TABS
1.0000 | ORAL_TABLET | Freq: Every evening | ORAL | Status: DC | PRN
  Filled 2014-04-18: qty 1

## 2014-04-18 MED ORDER — MIDAZOLAM HCL 2 MG/2ML IJ SOLN
INTRAMUSCULAR | Status: AC
  Filled 2014-04-18: qty 2

## 2014-04-18 MED ORDER — DIAZEPAM 5 MG PO TABS: 5.0000 mg | ORAL_TABLET | Freq: Four times a day (QID) | ORAL | Status: DC | PRN

## 2014-04-18 MED ORDER — ROCURONIUM BROMIDE 100 MG/10ML IV SOLN
INTRAVENOUS | Status: DC | PRN
  Administered 2014-04-18: 20 mg via INTRAVENOUS

## 2014-04-18 MED ORDER — ZOLPIDEM TARTRATE 5 MG PO TABS
5.0000 mg | ORAL_TABLET | Freq: Every evening | ORAL | Status: DC | PRN
  Administered 2014-04-18: 5 mg via ORAL
  Filled 2014-04-18: qty 1

## 2014-04-18 MED ORDER — BISACODYL 5 MG PO TBEC
5.0000 mg | DELAYED_RELEASE_TABLET | Freq: Every day | ORAL | Status: DC | PRN
  Filled 2014-04-18: qty 1

## 2014-04-18 MED ORDER — HYDROMORPHONE HCL 1 MG/ML IJ SOLN
INTRAMUSCULAR | Status: AC
  Filled 2014-04-18: qty 1

## 2014-04-18 MED ORDER — 0.9 % SODIUM CHLORIDE (POUR BTL) OPTIME
TOPICAL | Status: DC | PRN
  Administered 2014-04-18: 1000 mL

## 2014-04-18 MED ORDER — NEOSTIGMINE METHYLSULFATE 10 MG/10ML IV SOLN
INTRAVENOUS | Status: DC | PRN
  Administered 2014-04-18: 3 mg via INTRAVENOUS

## 2014-04-18 MED ORDER — MORPHINE SULFATE 2 MG/ML IJ SOLN: 1.0000 mg | INTRAMUSCULAR | Status: DC | PRN

## 2014-04-18 MED ORDER — MIDAZOLAM HCL 5 MG/5ML IJ SOLN
INTRAMUSCULAR | Status: DC | PRN
  Administered 2014-04-18: 1 mg via INTRAVENOUS

## 2014-04-18 MED ORDER — LACTATED RINGERS IV SOLN
INTRAVENOUS | Status: DC | PRN
  Administered 2014-04-18: 08:00:00 via INTRAVENOUS

## 2014-04-18 MED ORDER — ESCITALOPRAM OXALATE 20 MG PO TABS
20.0000 mg | ORAL_TABLET | Freq: Every day | ORAL | Status: DC
  Administered 2014-04-19: 20 mg via ORAL
  Filled 2014-04-18: qty 1

## 2014-04-18 MED ORDER — PROMETHAZINE HCL 25 MG/ML IJ SOLN
6.2500 mg | INTRAMUSCULAR | Status: DC | PRN
  Administered 2014-04-18: 6.25 mg via INTRAVENOUS

## 2014-04-18 MED ORDER — GLYCOPYRROLATE 0.2 MG/ML IJ SOLN
INTRAMUSCULAR | Status: AC
  Filled 2014-04-18: qty 3

## 2014-04-18 MED ORDER — MENTHOL 3 MG MT LOZG: 1.0000 | LOZENGE | OROMUCOSAL | Status: DC | PRN

## 2014-04-18 MED ORDER — EPHEDRINE SULFATE 50 MG/ML IJ SOLN
INTRAMUSCULAR | Status: DC | PRN
  Administered 2014-04-18: 10 mg via INTRAVENOUS

## 2014-04-18 MED ORDER — DOCUSATE SODIUM 100 MG PO CAPS
100.0000 mg | ORAL_CAPSULE | Freq: Two times a day (BID) | ORAL | Status: DC
  Administered 2014-04-18 – 2014-04-19 (×2): 100 mg via ORAL
  Filled 2014-04-18 (×4): qty 1

## 2014-04-18 MED ORDER — HYDROMORPHONE HCL 1 MG/ML IJ SOLN
0.2500 mg | INTRAMUSCULAR | Status: DC | PRN
  Administered 2014-04-18: 0.5 mg via INTRAVENOUS
  Administered 2014-04-18 (×2): 0.25 mg via INTRAVENOUS

## 2014-04-18 MED ORDER — PHENOL 1.4 % MT LIQD: 1.0000 | OROMUCOSAL | Status: DC | PRN

## 2014-04-18 MED ORDER — PHENYLEPHRINE 40 MCG/ML (10ML) SYRINGE FOR IV PUSH (FOR BLOOD PRESSURE SUPPORT)
PREFILLED_SYRINGE | INTRAVENOUS | Status: AC
  Filled 2014-04-18: qty 10

## 2014-04-18 MED ORDER — ACETAMINOPHEN 650 MG RE SUPP: 650.0000 mg | RECTAL | Status: DC | PRN

## 2014-04-18 MED ORDER — CEFAZOLIN SODIUM 1-5 GM-% IV SOLN
1.0000 g | Freq: Three times a day (TID) | INTRAVENOUS | Status: AC
  Administered 2014-04-18 (×2): 1 g via INTRAVENOUS
  Filled 2014-04-18 (×2): qty 50

## 2014-04-18 SURGICAL SUPPLY — 41 items
BANDAGE ADH SHEER 1  50/CT (GAUZE/BANDAGES/DRESSINGS) ×3 IMPLANT
BLADE SURG 15 STRL LF DISP TIS (BLADE) ×1 IMPLANT
BLADE SURG 15 STRL SS (BLADE) ×2
CEMENT BONE KYPHX HV R (Orthopedic Implant) ×3 IMPLANT
CEMENT KYPHON C01A KIT/MIXER (Cement) ×3 IMPLANT
COVER MAYO STAND STRL (DRAPES) ×3 IMPLANT
CURETTE WEDGE 8.5MM KYPHX (MISCELLANEOUS) ×3 IMPLANT
DERMABOND ADVANCED (GAUZE/BANDAGES/DRESSINGS) ×2
DERMABOND ADVANCED .7 DNX12 (GAUZE/BANDAGES/DRESSINGS) ×1 IMPLANT
DRAPE C-ARM 42X72 X-RAY (DRAPES) ×6 IMPLANT
DRAPE INCISE IOBAN 66X45 STRL (DRAPES) ×3 IMPLANT
DRAPE LAPAROTOMY T 102X78X121 (DRAPES) ×3 IMPLANT
DRAPE SURG 17X23 STRL (DRAPES) ×12 IMPLANT
DURAPREP 26ML APPLICATOR (WOUND CARE) ×3 IMPLANT
GAUZE SPONGE 4X4 16PLY XRAY LF (GAUZE/BANDAGES/DRESSINGS) ×3 IMPLANT
GLOVE BIO SURGEON STRL SZ7 (GLOVE) ×3 IMPLANT
GLOVE BIO SURGEON STRL SZ8 (GLOVE) ×3 IMPLANT
GLOVE BIOGEL PI IND STRL 7.0 (GLOVE) ×1 IMPLANT
GLOVE BIOGEL PI IND STRL 8 (GLOVE) ×1 IMPLANT
GLOVE BIOGEL PI INDICATOR 7.0 (GLOVE) ×2
GLOVE BIOGEL PI INDICATOR 8 (GLOVE) ×2
GOWN STRL REUS W/ TWL LRG LVL3 (GOWN DISPOSABLE) ×2 IMPLANT
GOWN STRL REUS W/ TWL XL LVL3 (GOWN DISPOSABLE) ×1 IMPLANT
GOWN STRL REUS W/TWL LRG LVL3 (GOWN DISPOSABLE) ×4
GOWN STRL REUS W/TWL XL LVL3 (GOWN DISPOSABLE) ×2
KIT BASIN OR (CUSTOM PROCEDURE TRAY) ×3 IMPLANT
KIT ROOM TURNOVER OR (KITS) ×3 IMPLANT
NEEDLE 22X1 1/2 (OR ONLY) (NEEDLE) ×3 IMPLANT
NEEDLE HYPO 25X1 1.5 SAFETY (NEEDLE) ×3 IMPLANT
NEEDLE SPNL 18GX3.5 QUINCKE PK (NEEDLE) ×3 IMPLANT
NS IRRIG 1000ML POUR BTL (IV SOLUTION) ×3 IMPLANT
PACK SURGICAL SETUP 50X90 (CUSTOM PROCEDURE TRAY) ×3 IMPLANT
PACK UNIVERSAL I (CUSTOM PROCEDURE TRAY) ×3 IMPLANT
PAD ARMBOARD 7.5X6 YLW CONV (MISCELLANEOUS) ×6 IMPLANT
POSITIONER HEAD PRONE TRACH (MISCELLANEOUS) ×3 IMPLANT
SUT MNCRL AB 4-0 PS2 18 (SUTURE) ×3 IMPLANT
SYR BULB IRRIGATION 50ML (SYRINGE) ×3 IMPLANT
SYR CONTROL 10ML LL (SYRINGE) ×3 IMPLANT
TOWEL OR 17X24 6PK STRL BLUE (TOWEL DISPOSABLE) ×3 IMPLANT
TOWEL OR 17X26 10 PK STRL BLUE (TOWEL DISPOSABLE) ×3 IMPLANT
TRAY KYPHOPAK 15/3 ONESTEP 1ST (MISCELLANEOUS) ×3 IMPLANT

## 2014-04-18 NOTE — Anesthesia Preprocedure Evaluation (Signed)
Anesthesia Evaluation    Airway       Dental   Pulmonary Current Smoker,          Cardiovascular     Neuro/Psych    GI/Hepatic   Endo/Other    Renal/GU      Musculoskeletal   Abdominal   Peds  Hematology   Anesthesia Other Findings   Reproductive/Obstetrics                           Anesthesia Physical Anesthesia Plan  ASA: II  Anesthesia Plan: General   Post-op Pain Management:    Induction:   Airway Management Planned:   Additional Equipment:   Intra-op Plan:   Post-operative Plan:   Informed Consent:   Plan Discussed with:   Anesthesia Plan Comments:         Anesthesia Quick Evaluation

## 2014-04-18 NOTE — Op Note (Signed)
NAMElige Ko:  Vicki Liu, Vicki Liu               ACCOUNT NO.:  [id number]  MEDICAL RECORD NO.:  [id number]  LOCATION:  [facility]                         FACILITY:  [facility]  PHYSICIAN:  Estill BambergMark Abagail Limb, MD      DATE OF BIRTH:  1945-11-28  DATE OF PROCEDURE:  04/18/2014 DATE OF DISCHARGE:                              OPERATIVE REPORT   PREOPERATIVE DIAGNOSIS:  T12 compression fracture.  POSTOPERATIVE DIAGNOSIS:  T12 compression fracture.  PROCEDURE:  T12 kyphoplasty.  SURGEON:  Estill BambergMark Linder Prajapati, MD  ASSISTANT:  None.  ANESTHESIA:  General endotracheal anesthesia.  COMPLICATIONS:  None.  DISPOSITION:  Stable.  ESTIMATED BLOOD LOSS:  Minimal.  INDICATIONS FOR SURGERY:  Briefly, Ms. [name] is a very pleasant 68- year-old female who I have been following with ongoing pain in the midback after an injury.  At approximately 4 months after her injury, she did continue to have pain despite conservative treatment measures. An MRI did reveal ongoing edema in the T12 vertebral body.  We therefore did discuss proceeding with a T12 kyphoplasty.  The patient did elect to proceed.  OPERATIVE DETAILS:  On April 18, 2014, the patient was brought to surgery and general endotracheal anesthesia was administered.  The patient was placed prone on a well-padded flat Jackson bed.  Gell rolls were placed under the patient's chest and hips.  The back was then prepped and draped in the usual sterile fashion.  Time-out procedure was performed.  AP and lateral fluoroscopy was appropriately positioned.  I then made 2 very small incisions at the superior and lateral aspect of the T12 pedicles.  Jamshidi was advanced across the T12 pedicles.  The tip of the Jamshidi needles were placed into the posterior aspect of the vertebral body.  I then inserted kyphoplasty balloons through the cannulas.  The kyphoplasty balloons were utilized to create a void and to partially elevate the superior endplate of T12.  This was  done successfully.  The kyphoplasty balloons were then removed.  The cement was then introduced into the cannulas.  I then introduced a total approximately 5 mL of cement.  I did liberally use AP and lateral fluoroscopy to ensure that there was no abnormal extravasation spent either posteriorly into the spinal canal, or into the vascular system. I was very pleased with the final radiographic appearance.  The cement was then allowed to harden.  The cannulas were then removed.  The wounds were then closed with 4-0 Monocryl.  Bacitracin was applied followed by Band- Aids bilaterally.  The patient was then awoken from general endotracheal anesthesia and transferred to recovery in stable condition.     Estill BambergMark Coryn Mosso, MD     MD/MEDQ  D:  04/18/2014  T:  04/18/2014  Job:  [id number]

## 2014-04-18 NOTE — Progress Notes (Signed)
Dr. Krista BlueSinger called to sign out patient. He is okay with her going to her room on 3C at this time. VSS

## 2014-04-18 NOTE — Transfer of Care (Signed)
Immediate Anesthesia Transfer of Care Note  Patient: Vicki Liu  Procedure(s) Performed: Procedure(s) with comments: KYPHOPLASTY (N/A) - Thoracic 12 kyphoplasty  Patient Location: PACU  Anesthesia Type:General  Level of Consciousness: awake and alert   Airway & Oxygen Therapy: Patient Spontanous Breathing and Patient connected to nasal cannula oxygen  Post-op Assessment: Report given to PACU RN and Post -op Vital signs reviewed and stable  Post vital signs: Reviewed and stable  Complications: No apparent anesthesia complications

## 2014-04-18 NOTE — H&P (Signed)
PREOPERATIVE H&P  Chief Complaint: mid-back pain  HPI: Vicki Liu is a 68 y.o. female who presents with ongoing pain in the mid-back for the last 4 months  MRI reveals edema in the T12 vertebral body  Patient has failed multiple forms of conservative care and continues to have pain (see office notes for additional details regarding the patient's full course of treatment)  Past Medical History  Diagnosis Date  . Depression   . History of kidney stones   . GERD (gastroesophageal reflux disease)   . H/O hiatal hernia   . Arthritis   . Chronic kidney disease     hx stones   Past Surgical History  Procedure Laterality Date  . Hernia repair  12    hiatal  . Gastric restriction surgery  6/12    partial gastrectomy  . Abdominal hysterectomy    . Tonsillectomy      adenoids  . Tubal ligation    . Anterior lat lumbar fusion Left 05/24/2013    Procedure: ANTERIOR LATERAL LUMBAR FUSION 3 LEVELS;  Surgeon: Emilee HeroMark Leonard Rashana Andrew, MD;  Location: [facility] OR;  Service: Orthopedics;  Laterality: Left;  Left sided lumbar 2-3, lumbar 3-4, lumbar 4-5 with allograft  . Carotid endarterectomy Bilateral 2008  . Colonoscopy w/ biopsies and polypectomy      benign Bx   History   Social History  . Marital Status: Married    Spouse Name: N/A    Number of Children: N/A  . Years of Education: N/A   Social History Main Topics  . Smoking status: Current Every Day Smoker -- 0.50 packs/day for 10 years    Types: Cigarettes  . Smokeless tobacco: Never Used  . Alcohol Use: No  . Drug Use: No  . Sexual Activity: None   Other Topics Concern  . None   Social History Narrative  . None   History reviewed. No pertinent family history. Allergies  Allergen Reactions  . Codeine Nausea And Vomiting and Other (See Comments)    Gastritis  . Celexa [Citalopram Hydrobromide] Other (See Comments)    Caused high blood pressure.   . Levaquin [Levofloxacin] Other (See Comments)    Redness all over  arms  . Nsaids Other (See Comments)    gastritis   Prior to Admission medications   Medication Sig Start Date End Date Taking? Authorizing Provider  aspirin EC 81 MG tablet Take 81 mg by mouth daily.   Yes Historical Provider, MD  CALCIUM-VITAMIN D PO Take 3 tablets by mouth 2 (two) times daily. 1000 units of vitamin D + 1000mg  of calcium   Yes Historical Provider, MD  cholecalciferol (VITAMIN D) 1000 UNITS tablet Take 1,000 Units by mouth 2 (two) times daily.   Yes Historical Provider, MD  Cyanocobalamin (VITAMIN B-12 IJ) Inject 1 Applicatorful as directed See admin instructions. Every other month   Yes Historical Provider, MD  escitalopram (LEXAPRO) 20 MG tablet Take 20 mg by mouth daily.   Yes Historical Provider, MD  HYDROcodone-acetaminophen (NORCO/VICODIN) 5-325 MG per tablet Take 1 tablet by mouth every 6 (six) hours as needed for moderate pain.   Yes Historical Provider, MD  Multiple Vitamins-Minerals (MULTIVITAMIN WITH MINERALS) tablet Take 1 tablet by mouth 3 (three) times daily.   Yes Historical Provider, MD  omeprazole (PRILOSEC) 20 MG capsule Take 20 mg by mouth daily.   Yes Historical Provider, MD  zolpidem (AMBIEN) 10 MG tablet Take 10 mg by mouth at bedtime as needed for sleep.  Yes Historical Provider, MD  meclizine (ANTIVERT) 25 MG tablet Take 25 mg by mouth 2 (two) times daily as needed for dizziness.     Historical Provider, MD     All other systems have been reviewed and were otherwise negative with the exception of those mentioned in the HPI and as above.  Physical Exam: Filed Vitals:   04/18/14 0731  BP: 132/73  Pulse: 66  Temp: 97.6 F (36.4 C)  Resp: 20    General: Alert, no acute distress Cardiovascular: No pedal edema Respiratory: No cyanosis, no use of accessory musculature Skin: No lesions in the area of chief complaint Neurologic: Sensation intact distally Psychiatric: Patient is competent for consent with normal mood and affect Lymphatic: No  axillary or cervical lymphadenopathy  MUSCULOSKELETAL: + TTP at lower thoracic region  Assessment/Plan: Thoracic 12 compression fracture Plan for Procedure(s): KYPHOPLASTY   Emilee HeroUMONSKI,Percival Glasheen LEONARD, MD 04/18/2014 8:13 AM

## 2014-04-18 NOTE — Anesthesia Procedure Notes (Signed)
Procedure Name: Intubation Date/Time: 04/18/2014 8:45 AM Performed by: Gwenyth AllegraADAMI, Ardeth Repetto Pre-anesthesia Checklist: Emergency Drugs available, Patient identified, Timeout performed, Suction available and Patient being monitored Patient Re-evaluated:Patient Re-evaluated prior to inductionOxygen Delivery Method: Circle system utilized Preoxygenation: Pre-oxygenation with 100% oxygen Intubation Type: IV induction Ventilation: Mask ventilation without difficulty Laryngoscope Size: Mac and 3 Grade View: Grade II Tube type: Oral Tube size: 7.0 mm Number of attempts: 1 Airway Equipment and Method: Stylet Secured at: 22 cm Tube secured with: Tape Dental Injury: Teeth and Oropharynx as per pre-operative assessment

## 2014-04-18 NOTE — Anesthesia Postprocedure Evaluation (Signed)
Anesthesia Post Note  Patient: Vicki Liu  Procedure(s) Performed: Procedure(s) (LRB): KYPHOPLASTY (N/A)  Anesthesia type: general  Patient location: PACU  Post pain: Pain level controlled  Post assessment: Patient's Cardiovascular Status Stable  Last Vitals:  Filed Vitals:   04/18/14 1115  BP: 109/53  Pulse: 68  Temp: 36.2 C  Resp: 14    Post vital signs: Reviewed and stable  Level of consciousness: sedated  Complications: No apparent anesthesia complications

## 2014-04-19 MED ORDER — HYDROCODONE-ACETAMINOPHEN 5-325 MG PO TABS: 1.0000 | ORAL_TABLET | Freq: Four times a day (QID) | ORAL | Status: DC | PRN

## 2014-04-19 MED ORDER — CYCLOBENZAPRINE HCL 5 MG PO TABS: 5.0000 mg | ORAL_TABLET | Freq: Three times a day (TID) | ORAL | Status: DC | PRN

## 2014-04-19 NOTE — Progress Notes (Signed)
    Patient doing very well. Resolved compression fx pain. Minimal M-LBP. Has been up and ambulating. Some residual muscular px and stiffness as expected.    Physical Exam: BP 136/65  Pulse 71  Temp(Src) 98.5 F (36.9 C) (Oral)  Resp 16  Wt 65.318 kg (144 lb)  SpO2 98%  Dressing in place, pt laying comfortably in hospital bed breathing normally, - Homans, NVI  POD #1 s/p T12 Kyphoplasty   - Encourage ambulation - Activity as tolerated - Norco for px, flexeril for muscles spasms  -Scripts printed and in chart - likely d/c home today after breakfast

## 2014-04-19 NOTE — Progress Notes (Signed)
Patient alert and oriented, mae's well, voiding adequate amount of urine, swallowing without difficulty, no c/o pain. Patient discharged home with family. Script and discharged instructions given to patient. Patient and family stated understanding of d/c instructions given and has an appointment with MD. Aisha Caelin Rosen RN 

## 2014-04-20 ENCOUNTER — Encounter (HOSPITAL_COMMUNITY): Payer: Self-pay | Admitting: Orthopedic Surgery

## 2014-04-25 NOTE — Discharge Summary (Signed)
Patient ID: Vicki Liu MRN: [id number] DOB/AGE: 68-Mar-1947 68 y.o.  Admit date: 04/18/2014 Discharge date: 04/19/2014  Admission Diagnoses:  Active Problems:   Compression fracture   Discharge Diagnoses:  Same  Past Medical History  Diagnosis Date  . Depression   . History of kidney stones   . GERD (gastroesophageal reflux disease)   . H/O hiatal hernia   . Arthritis   . Chronic kidney disease     hx stones    Surgeries: Procedure(s): KYPHOPLASTY T12 on 04/18/2014   Consultants:  None  Discharged Condition: Improved  Hospital Course: Vicki Liu is an 68 y.o. female who was admitted 04/18/2014 for operative treatment of T12 Compression Fx. Patient has severe unremitting pain that affects sleep, daily activities, and work/hobbies. After pre-op clearance the patient was taken to the operating room on 04/18/2014 and underwent  Procedure(s): T12 KYPHOPLASTY.    Patient was given perioperative antibiotics:  Anti-infectives   Start     Dose/Rate Route Frequency Ordered Stop   04/18/14 1600  ceFAZolin (ANCEF) IVPB 1 g/50 mL premix     1 g 100 mL/hr over 30 Minutes Intravenous Every 8 hours 04/18/14 1159 04/18/14 2304   04/18/14 0600  ceFAZolin (ANCEF) IVPB 2 g/50 mL premix     2 g 100 mL/hr over 30 Minutes Intravenous On call to O.R. 04/17/14 1437 04/18/14 0835       Patient was given sequential compression devices, early ambulation to prevent DVT.  Patient benefited maximally from hospital stay and there were no complications.    Recent vital signs: BP 146/73  Pulse 66  Temp(Src) 98.3 F (36.8 C) (Oral)  Resp 18  Wt 65.318 kg (144 lb)  SpO2 98%  Discharge Medications:     Medication List         aspirin EC 81 MG tablet  Take 81 mg by mouth daily.     CALCIUM-VITAMIN D PO  Take 3 tablets by mouth 2 (two) times daily. 1000 units of vitamin D + 1000mg  of calcium     cholecalciferol 1000 UNITS tablet  Commonly known as:  VITAMIN D  Take 1,000  Units by mouth 2 (two) times daily.     cyclobenzaprine 5 MG tablet  Commonly known as:  FLEXERIL  Take 1 tablet (5 mg total) by mouth 3 (three) times daily as needed for muscle spasms.     escitalopram 20 MG tablet  Commonly known as:  LEXAPRO  Take 20 mg by mouth daily.     HYDROcodone-acetaminophen 5-325 MG per tablet  Commonly known as:  NORCO/VICODIN  Take 1 tablet by mouth every 6 (six) hours as needed for moderate pain.     HYDROcodone-acetaminophen 5-325 MG per tablet  Commonly known as:  NORCO/VICODIN  Take 1 tablet by mouth every 6 (six) hours as needed for moderate pain or severe pain.     meclizine 25 MG tablet  Commonly known as:  ANTIVERT  Take 25 mg by mouth 2 (two) times daily as needed for dizziness.     multivitamin with minerals tablet  Take 1 tablet by mouth 3 (three) times daily.     omeprazole 20 MG capsule  Commonly known as:  PRILOSEC  Take 20 mg by mouth daily.     VITAMIN B-12 IJ  Inject 1 Applicatorful as directed See admin instructions. Every other month     zolpidem 10 MG tablet  Commonly known as:  AMBIEN  Take 10 mg by mouth at bedtime  as needed for sleep.        Diagnostic Studies: Dg Thoracolumbar Spine  04/18/2014   CLINICAL DATA:  T12 kyphoplasty  EXAM: THORACOLUMBAR SPINE - 2 VIEW; DG C-ARM 61-120 MIN  COMPARISON:  MRI 04/07/2014  FINDINGS: 4 images from portable intraoperative C-arm radiography shows vertebral augmentation of the T12 vertebral compression fracture.  IMPRESSION: Status post T12 vertebroplasty.   Electronically Signed   By: Signa Kellaylor  Stroud M.D.   On: 04/18/2014 10:35   Dg C-arm 1-60 Min  04/18/2014   CLINICAL DATA:  T12 kyphoplasty  EXAM: THORACOLUMBAR SPINE - 2 VIEW; DG C-ARM 61-120 MIN  COMPARISON:  MRI 04/07/2014  FINDINGS: 4 images from portable intraoperative C-arm radiography shows vertebral augmentation of the T12 vertebral compression fracture.  IMPRESSION: Status post T12 vertebroplasty.   Electronically Signed    By: Signa Kellaylor  Stroud M.D.   On: 04/18/2014 10:35    Disposition: 01-Home or Self Care   POD #1 s/p T12 Kyphoplasty  - Encourage ambulation  - Activity as tolerated  - Norco for px, flexeril for muscles spasms  -Written scripts for pain signed and in chart -D/C instructions sheet printed and in chart -D/C today  -F/U in office 2 weeks   Signed: Georga BoraMCKENZIE, Huda Petrey J 04/25/2014, 1:08 PM

## 2020-03-13 ENCOUNTER — Encounter (HOSPITAL_BASED_OUTPATIENT_CLINIC_OR_DEPARTMENT_OTHER): Payer: Self-pay | Admitting: *Deleted

## 2020-03-13 ENCOUNTER — Other Ambulatory Visit: Payer: Self-pay

## 2020-03-13 DIAGNOSIS — N189 Chronic kidney disease, unspecified: Secondary | ICD-10-CM | POA: Insufficient documentation

## 2020-03-13 DIAGNOSIS — F17210 Nicotine dependence, cigarettes, uncomplicated: Secondary | ICD-10-CM | POA: Diagnosis not present

## 2020-03-13 DIAGNOSIS — Z79899 Other long term (current) drug therapy: Secondary | ICD-10-CM | POA: Insufficient documentation

## 2020-03-13 DIAGNOSIS — M79675 Pain in left toe(s): Secondary | ICD-10-CM | POA: Insufficient documentation

## 2020-03-13 DIAGNOSIS — Z7982 Long term (current) use of aspirin: Secondary | ICD-10-CM | POA: Insufficient documentation

## 2020-03-13 NOTE — ED Triage Notes (Signed)
C/o left big toe pain x 1 day , denies injury

## 2020-03-14 ENCOUNTER — Emergency Department (HOSPITAL_BASED_OUTPATIENT_CLINIC_OR_DEPARTMENT_OTHER)
Admission: EM | Admit: 2020-03-14 | Discharge: 2020-03-14 | Disposition: A | Discharge status: Home / Self Care | Payer: Medicare Other | Attending: Emergency Medicine | Admitting: Emergency Medicine

## 2020-03-14 DIAGNOSIS — M79675 Pain in left toe(s): Secondary | ICD-10-CM | POA: Diagnosis not present

## 2020-03-14 MED ORDER — PREDNISONE 50 MG PO TABS: 50.0000 mg | ORAL_TABLET | Freq: Every evening | ORAL | 0 refills | Status: DC

## 2020-03-14 MED ORDER — PREDNISONE 10 MG PO TABS
60.0000 mg | ORAL_TABLET | Freq: Once | ORAL | Status: AC
Start: 2020-03-14 — End: 2020-03-14
  Administered 2020-03-14: 60 mg via ORAL
  Filled 2020-03-14: qty 1

## 2020-03-14 MED ORDER — HYDROCODONE-ACETAMINOPHEN 5-325 MG PO TABS
1.0000 | ORAL_TABLET | Freq: Once | ORAL | Status: AC
  Administered 2020-03-14: 1 via ORAL
  Filled 2020-03-14: qty 1

## 2020-03-14 MED ORDER — HYDROCODONE-ACETAMINOPHEN 5-325 MG PO TABS: 1.0000 | ORAL_TABLET | Freq: Four times a day (QID) | ORAL | 0 refills | Status: DC | PRN

## 2020-03-14 NOTE — ED Provider Notes (Signed)
[facility] EMERGENCY DEPARTMENT Provider Note   CSN: [id number] Arrival date & time: 03/13/20  2339     History Chief Complaint  Patient presents with   Toe Pain    Vicki Liu is a 74 y.o. female.  The history is provided by the patient and a relative.  Toe Pain This is a new problem. The current episode started 12 to 24 hours ago. The problem occurs constantly. The problem has been rapidly worsening. The symptoms are aggravated by walking. Nothing relieves the symptoms. She has tried nothing for the symptoms.   Patient reports atraumatic left great toe pain.  No fevers or vomiting.  She has never had this before.    Past Medical History:  Diagnosis Date   Arthritis    Chronic kidney disease    hx stones   Depression    GERD (gastroesophageal reflux disease)    H/O hiatal hernia    History of kidney stones     Patient Active Problem List   Diagnosis Date Noted   Compression fracture 04/18/2014   Radiculopathy 05/24/2013   Spinal stenosis of lumbar region 05/24/2013    Past Surgical History:  Procedure Laterality Date   ABDOMINAL HYSTERECTOMY     ANTERIOR LAT LUMBAR FUSION Left 05/24/2013   Procedure: ANTERIOR LATERAL LUMBAR FUSION 3 LEVELS;  Surgeon: Emilee Hero, MD;  Location: [facility] OR;  Service: Orthopedics;  Laterality: Left;  Left sided lumbar 2-3, lumbar 3-4, lumbar 4-5 with allograft   CAROTID ENDARTERECTOMY Bilateral 2008   COLONOSCOPY W/ BIOPSIES AND POLYPECTOMY     benign Bx   GASTRIC RESTRICTION SURGERY  6/12   partial gastrectomy   HERNIA REPAIR  12   hiatal   KYPHOPLASTY N/A 04/18/2014   Procedure: KYPHOPLASTY;  Surgeon: Emilee Hero, MD;  Location: [facility] OR;  Service: Orthopedics;  Laterality: N/A;  Thoracic 12 kyphoplasty   TONSILLECTOMY     adenoids   TUBAL LIGATION       OB History   No obstetric history on file.     History reviewed. No pertinent family history.  Social History    Tobacco Use   Smoking status: Current Every Day Smoker    Packs/day: 0.50    Years: 10.00    Pack years: 5.00    Types: Cigarettes   Smokeless tobacco: Never Used  Substance Use Topics   Alcohol use: No   Drug use: No    Home Medications Prior to Admission medications   Medication Sig Start Date End Date Taking? Authorizing Provider  aspirin EC 81 MG tablet Take 81 mg by mouth daily.    [provider]  CALCIUM-VITAMIN D PO Take 3 tablets by mouth 2 (two) times daily. 1000 units of vitamin D + 1000mg  of calcium    [provider]  cholecalciferol (VITAMIN D) 1000 UNITS tablet Take 1,000 Units by mouth 2 (two) times daily.    [provider]  Cyanocobalamin (VITAMIN B-12 IJ) Inject 1 Applicatorful as directed See admin instructions. Every other month    [provider]  escitalopram (LEXAPRO) 20 MG tablet Take 20 mg by mouth daily.    [provider]  meclizine (ANTIVERT) 25 MG tablet Take 25 mg by mouth 2 (two) times daily as needed for dizziness.     [provider]  Multiple Vitamins-Minerals (MULTIVITAMIN WITH MINERALS) tablet Take 1 tablet by mouth 3 (three) times daily.    [provider]  omeprazole (PRILOSEC) 20 MG capsule Take  20 mg by mouth daily.    [provider]  zolpidem (AMBIEN) 10 MG tablet Take 10 mg by mouth at bedtime as needed for sleep.    [provider]    Allergies    Codeine, Celexa [citalopram hydrobromide], Levaquin [levofloxacin], and Nsaids  Review of Systems   Review of Systems  Constitutional: Negative for fever.  Gastrointestinal: Negative for vomiting.  Musculoskeletal: Positive for arthralgias.    Physical Exam Updated Vital Signs BP (!) 180/66 (BP Location: Right Arm)    Pulse 80    Resp 18    Ht 1.6 m (5\' 3" )    Wt 76.2 kg    SpO2 100%    BMI 29.76 kg/m   Physical Exam CONSTITUTIONAL: elderly, anxious HEAD: Normocephalic/atraumatic EYES: EOMI  ENMT:  Mucous membranes moist NECK: supple no meningeal signs LUNGS:   no apparent distress ABDOMEN: soft  NEURO: Pt is awake/alert/appropriate, moves all extremitiesx4.  No facial droop.   EXTREMITIES: pulses normal/equal, full ROM, erythema/swelling noted to left 1st MTP.  No crepitus.  No abscess.  No puncture wounds.  No deformities SKIN: warm, color normal PSYCH: anxious  ED Results / Procedures / Treatments   Labs (all labs ordered are listed, but only abnormal results are displayed) Labs Reviewed - No data to display  EKG None  Radiology No results found.  Procedures Procedures  Medications Ordered in ED Medications  predniSONE (DELTASONE) tablet 60 mg (60 mg Oral Given 03/14/20 0428)  HYDROcodone-acetaminophen (NORCO/VICODIN) 5-325 MG per tablet 1 tablet (1 tablet Oral Given 03/14/20 0429)    ED Course  I have reviewed the triage vital signs and the nursing notes.     MDM Rules/Calculators/A&P                          Strong suspicion this represents gout attack.  Low suspicion for septic joint.  Patient reports pain was sudden onset.  Plan treat with pain medications and prednisone due to age  Final Clinical Impression(s) / ED Diagnoses Final diagnoses:  Pain of left great toe    Rx / DC Orders ED Discharge Orders    None       03/16/20, MD 03/14/20 [phone]

## 2020-11-18 ENCOUNTER — Encounter (HOSPITAL_COMMUNITY): Payer: Self-pay | Admitting: Emergency Medicine

## 2020-11-18 ENCOUNTER — Inpatient Hospital Stay (HOSPITAL_COMMUNITY)
Admission: EM | Admit: 2020-11-18 | Discharge: 2020-11-20 | DRG: 392 | Disposition: A | Discharge status: Home / Self Care | Payer: Medicare Other | Attending: Family Medicine | Admitting: Family Medicine

## 2020-11-18 ENCOUNTER — Other Ambulatory Visit: Payer: Self-pay

## 2020-11-18 DIAGNOSIS — Z981 Arthrodesis status: Secondary | ICD-10-CM

## 2020-11-18 DIAGNOSIS — N179 Acute kidney failure, unspecified: Secondary | ICD-10-CM | POA: Diagnosis not present

## 2020-11-18 DIAGNOSIS — F17210 Nicotine dependence, cigarettes, uncomplicated: Secondary | ICD-10-CM | POA: Diagnosis present

## 2020-11-18 DIAGNOSIS — Z885 Allergy status to narcotic agent status: Secondary | ICD-10-CM

## 2020-11-18 DIAGNOSIS — Z7982 Long term (current) use of aspirin: Secondary | ICD-10-CM

## 2020-11-18 DIAGNOSIS — D539 Nutritional anemia, unspecified: Secondary | ICD-10-CM | POA: Diagnosis present

## 2020-11-18 DIAGNOSIS — Z881 Allergy status to other antibiotic agents status: Secondary | ICD-10-CM

## 2020-11-18 DIAGNOSIS — K529 Noninfective gastroenteritis and colitis, unspecified: Secondary | ICD-10-CM | POA: Diagnosis present

## 2020-11-18 DIAGNOSIS — Z886 Allergy status to analgesic agent status: Secondary | ICD-10-CM

## 2020-11-18 DIAGNOSIS — Z87442 Personal history of urinary calculi: Secondary | ICD-10-CM

## 2020-11-18 DIAGNOSIS — Z7952 Long term (current) use of systemic steroids: Secondary | ICD-10-CM

## 2020-11-18 DIAGNOSIS — F32A Depression, unspecified: Secondary | ICD-10-CM | POA: Diagnosis present

## 2020-11-18 DIAGNOSIS — E538 Deficiency of other specified B group vitamins: Secondary | ICD-10-CM | POA: Diagnosis present

## 2020-11-18 DIAGNOSIS — N1832 Chronic kidney disease, stage 3b: Secondary | ICD-10-CM | POA: Diagnosis present

## 2020-11-18 DIAGNOSIS — Z20822 Contact with and (suspected) exposure to covid-19: Secondary | ICD-10-CM | POA: Diagnosis present

## 2020-11-18 DIAGNOSIS — Z79899 Other long term (current) drug therapy: Secondary | ICD-10-CM

## 2020-11-18 DIAGNOSIS — I129 Hypertensive chronic kidney disease with stage 1 through stage 4 chronic kidney disease, or unspecified chronic kidney disease: Secondary | ICD-10-CM | POA: Diagnosis present

## 2020-11-18 DIAGNOSIS — Z9884 Bariatric surgery status: Secondary | ICD-10-CM

## 2020-11-18 DIAGNOSIS — K5792 Diverticulitis of intestine, part unspecified, without perforation or abscess without bleeding: Secondary | ICD-10-CM | POA: Diagnosis not present

## 2020-11-18 DIAGNOSIS — R109 Unspecified abdominal pain: Secondary | ICD-10-CM

## 2020-11-18 DIAGNOSIS — K219 Gastro-esophageal reflux disease without esophagitis: Secondary | ICD-10-CM | POA: Diagnosis present

## 2020-11-18 LAB — URINALYSIS, ROUTINE W REFLEX MICROSCOPIC
Bilirubin Urine: NEGATIVE
Glucose, UA: NEGATIVE mg/dL
Hgb urine dipstick: NEGATIVE
Ketones, ur: NEGATIVE mg/dL
Nitrite: NEGATIVE
Protein, ur: NEGATIVE mg/dL
Specific Gravity, Urine: 1.014 (ref 1.005–1.030)
pH: 5 (ref 5.0–8.0)

## 2020-11-18 LAB — COMPREHENSIVE METABOLIC PANEL
ALT: 11 U/L (ref 0–44)
AST: 14 U/L — ABNORMAL LOW (ref 15–41)
Albumin: 3.3 g/dL — ABNORMAL LOW (ref 3.5–5.0)
Alkaline Phosphatase: 70 U/L (ref 38–126)
Anion gap: 9 (ref 5–15)
BUN: 21 mg/dL (ref 8–23)
CO2: 21 mmol/L — ABNORMAL LOW (ref 22–32)
Calcium: 8.5 mg/dL — ABNORMAL LOW (ref 8.9–10.3)
Chloride: 109 mmol/L (ref 98–111)
Creatinine, Ser: 1.92 mg/dL — ABNORMAL HIGH (ref 0.44–1.00)
GFR, Estimated: 27 mL/min — ABNORMAL LOW (ref >60)
Glucose, Bld: 92 mg/dL (ref 70–99)
Potassium: 3.9 mmol/L (ref 3.5–5.1)
Sodium: 139 mmol/L (ref 135–145)
Total Bilirubin: 0.5 mg/dL (ref 0.3–1.2)
Total Protein: 5.9 g/dL — ABNORMAL LOW (ref 6.5–8.1)

## 2020-11-18 LAB — CBC WITH DIFFERENTIAL/PLATELET
Abs Immature Granulocytes: 0.02 10*3/uL (ref 0.00–0.07)
Basophils Absolute: 0 10*3/uL (ref 0.0–0.1)
Basophils Relative: 1 %
Eosinophils Absolute: 0.5 10*3/uL (ref 0.0–0.5)
Eosinophils Relative: 7 %
HCT: 34.5 % — ABNORMAL LOW (ref 36.0–46.0)
Hemoglobin: 11.1 g/dL — ABNORMAL LOW (ref 12.0–15.0)
Immature Granulocytes: 0 %
Lymphocytes Relative: 19 %
Lymphs Abs: 1.2 10*3/uL (ref 0.7–4.0)
MCH: 32.6 pg (ref 26.0–34.0)
MCHC: 32.2 g/dL (ref 30.0–36.0)
MCV: 101.2 fL — ABNORMAL HIGH (ref 80.0–100.0)
Monocytes Absolute: 0.5 10*3/uL (ref 0.1–1.0)
Monocytes Relative: 8 %
Neutro Abs: 4 10*3/uL (ref 1.7–7.7)
Neutrophils Relative %: 65 %
Platelets: 222 10*3/uL (ref 150–400)
RBC: 3.41 MIL/uL — ABNORMAL LOW (ref 3.87–5.11)
RDW: 14.2 % (ref 11.5–15.5)
WBC: 6.3 10*3/uL (ref 4.0–10.5)
nRBC: 0 % (ref 0.0–0.2)

## 2020-11-18 LAB — LIPASE, BLOOD: Lipase: 22 U/L (ref 11–51)

## 2020-11-18 MED ORDER — ACETAMINOPHEN 325 MG PO TABS
650.0000 mg | ORAL_TABLET | Freq: Four times a day (QID) | ORAL | Status: DC | PRN
Start: 2020-11-18 — End: 2020-11-20
  Administered 2020-11-18 – 2020-11-19 (×2): 650 mg via ORAL
  Filled 2020-11-18 (×2): qty 2

## 2020-11-18 MED ORDER — LACTATED RINGERS IV SOLN: INTRAVENOUS | Status: DC

## 2020-11-18 MED ORDER — SODIUM CHLORIDE 0.9 % IV SOLN
3.0000 g | Freq: Once | INTRAVENOUS | Status: AC
  Administered 2020-11-18: 3 g via INTRAVENOUS
  Filled 2020-11-18: qty 3

## 2020-11-18 MED ORDER — HEPARIN SODIUM (PORCINE) 5000 UNIT/ML IJ SOLN
5000.0000 [IU] | Freq: Three times a day (TID) | INTRAMUSCULAR | Status: DC
  Administered 2020-11-18 – 2020-11-20 (×6): 5000 [IU] via SUBCUTANEOUS
  Filled 2020-11-18 (×6): qty 1

## 2020-11-18 MED ORDER — MORPHINE SULFATE (PF) 2 MG/ML IV SOLN
1.0000 mg | INTRAVENOUS | Status: DC | PRN
Start: 2020-11-18 — End: 2020-11-20

## 2020-11-18 MED ORDER — LACTATED RINGERS IV BOLUS: 1000.0000 mL | Freq: Once | INTRAVENOUS | Status: DC

## 2020-11-18 MED ORDER — ACETAMINOPHEN 650 MG RE SUPP: 650.0000 mg | Freq: Four times a day (QID) | RECTAL | Status: DC | PRN

## 2020-11-18 MED ORDER — SODIUM CHLORIDE 0.9 % IV SOLN
1.5000 g | Freq: Three times a day (TID) | INTRAVENOUS | Status: DC
  Administered 2020-11-19 – 2020-11-20 (×5): 1.5 g via INTRAVENOUS
  Filled 2020-11-18 (×7): qty 4

## 2020-11-18 NOTE — H&P (Signed)
History and Physical    Vicki Liu BJS:[id number] DOB: Sep 27, 1945 DOA: 11/18/2020  PCP: Charolett Bumpers, PA-C   Patient coming from: Home.  Chief Complaint: Abdominal pain and diarrhea.  HPI: Vicki Liu is a 75 y.o. female with history of hypertension, chronic disease stage III, anemia, history of gastric sleeve surgery has been experiencing diarrhea about 4 days ago followed with abdominal pain mostly in the left lower quadrant had followed up with primary care physician had ordered a CT abdomen pelvis which showed features concerning for colitis and was referred to the ER.  Patient states whenever she eats something her abdominal pain increases and she gets diarrhea.  Diarrhea is usually watery and nonbloody.  Has subjective feeling of fever chills.  Patient states she had an attack of diverticulitis in March and was admitted at [facility] at the time she took antibiotics.  Last colonoscopy was around 8 years ago.  ED Course: On exam in the ER pain is mostly in the left lower quadrant.  Labs show macrocytic anemia with hemoglobin of 11.1 creatinine 1.9 baseline is around 1.6 LFTs are largely unremarkable.  COVID test is negative.  Patient started on empiric antibiotics IV fluids admitted for further management of colitis/diverticulitis with diarrhea.  Review of Systems: As per HPI, rest all negative.   Past Medical History:  Diagnosis Date  . Arthritis   . Chronic kidney disease    hx stones  . Depression   . GERD (gastroesophageal reflux disease)   . H/O hiatal hernia   . History of kidney stones     Past Surgical History:  Procedure Laterality Date  . ABDOMINAL HYSTERECTOMY    . ANTERIOR LAT LUMBAR FUSION Left 05/24/2013   Procedure: ANTERIOR LATERAL LUMBAR FUSION 3 LEVELS;  Surgeon: Emilee Hero, MD;  Location: [facility] OR;  Service: Orthopedics;  Laterality: Left;  Left sided lumbar 2-3, lumbar 3-4, lumbar 4-5 with allograft  . CAROTID ENDARTERECTOMY Bilateral 2008   . COLONOSCOPY W/ BIOPSIES AND POLYPECTOMY     benign Bx  . GASTRIC RESTRICTION SURGERY  6/12   partial gastrectomy  . HERNIA REPAIR  12   hiatal  . KYPHOPLASTY N/A 04/18/2014   Procedure: KYPHOPLASTY;  Surgeon: Emilee Hero, MD;  Location: [facility] OR;  Service: Orthopedics;  Laterality: N/A;  Thoracic 12 kyphoplasty  . TONSILLECTOMY     adenoids  . TUBAL LIGATION       reports that she has been smoking cigarettes. She has a 5.00 pack-year smoking history. She has never used smokeless tobacco. She reports that she does not drink alcohol and does not use drugs.  Allergies  Allergen Reactions  . Codeine Nausea And Vomiting and Other (See Comments)    Gastritis  . Celexa [Citalopram Hydrobromide] Other (See Comments)    Caused high blood pressure.   . Levaquin [Levofloxacin] Other (See Comments)    Redness all over arms  . Nsaids Other (See Comments)    gastritis    History reviewed. No pertinent family history.  Prior to Admission medications   Medication Sig Start Date End Date Taking? Authorizing Provider  aspirin EC 81 MG tablet Take 81 mg by mouth daily.    [provider]  CALCIUM-VITAMIN D PO Take 3 tablets by mouth 2 (two) times daily. 1000 units of vitamin D + 1000mg  of calcium    [provider]  cholecalciferol (VITAMIN D) 1000 UNITS tablet Take 1,000 Units by mouth 2 (two) times daily.  [provider]  Cyanocobalamin (VITAMIN B-12 IJ) Inject 1 Applicatorful as directed See admin instructions. Every other month    [provider]  escitalopram (LEXAPRO) 20 MG tablet Take 20 mg by mouth daily.    [provider]  HYDROcodone-acetaminophen (NORCO/VICODIN) 5-325 MG tablet Take 1 tablet by mouth every 6 (six) hours as needed for severe pain. 03/14/20   Zadie Rhine, MD  meclizine (ANTIVERT) 25 MG tablet Take 25 mg by mouth 2 (two) times daily as needed for dizziness.     [provider]  Multiple  Vitamins-Minerals (MULTIVITAMIN WITH MINERALS) tablet Take 1 tablet by mouth 3 (three) times daily.    [provider]  omeprazole (PRILOSEC) 20 MG capsule Take 20 mg by mouth daily.    [provider]  predniSONE (DELTASONE) 50 MG tablet Take 1 tablet (50 mg total) by mouth at bedtime. 03/14/20   Zadie Rhine, MD  zolpidem (AMBIEN) 10 MG tablet Take 10 mg by mouth at bedtime as needed for sleep.    [provider]    Physical Exam: Constitutional: Moderately built and nourished. Vitals:   11/18/20 1346 11/18/20 1752  BP: 115/73 (!) 193/100  Pulse: (!) 101 68  Resp: 16 18  Temp: 98.3 F (36.8 C)   TempSrc: Oral   SpO2: 99% 100%   Eyes: Anicteric no pallor. ENMT: No discharge from the ears eyes nose or mouth. Neck: No mass felt.  No neck rigidity. Respiratory: No rhonchi or crepitations. Cardiovascular: S1-S2 heard. Abdomen: Tenderness in left lower quadrant no guarding or rigidity.  No rebound tenderness. Musculoskeletal: No edema. Skin: No rash. Neurologic: Alert awake oriented to time place and person.  Moves all extremities. Psychiatric: Appears normal.  Normal affect.   Labs on Admission: I have personally reviewed following labs and imaging studies  CBC: Recent Labs  Lab 11/18/20 1439  WBC 6.3  NEUTROABS 4.0  HGB 11.1*  HCT 34.5*  MCV 101.2*  PLT 222   Basic Metabolic Panel: Recent Labs  Lab 11/18/20 1439  NA 139  K 3.9  CL 109  CO2 21*  GLUCOSE 92  BUN 21  CREATININE 1.92*  CALCIUM 8.5*   GFR: CrCl cannot be calculated (Unknown ideal weight.). Liver Function Tests: Recent Labs  Lab 11/18/20 1439  AST 14*  ALT 11  ALKPHOS 70  BILITOT 0.5  PROT 5.9*  ALBUMIN 3.3*   Recent Labs  Lab 11/18/20 1439  LIPASE 22   No results for input(s): AMMONIA in the last 168 hours. Coagulation Profile: No results for input(s): INR, PROTIME in the last 168 hours. Cardiac Enzymes: No results for input(s): CKTOTAL, CKMB,  CKMBINDEX, TROPONINI in the last 168 hours. BNP (last 3 results) No results for input(s): PROBNP in the last 8760 hours. HbA1C: No results for input(s): HGBA1C in the last 72 hours. CBG: No results for input(s): GLUCAP in the last 168 hours. Lipid Profile: No results for input(s): CHOL, HDL, LDLCALC, TRIG, CHOLHDL, LDLDIRECT in the last 72 hours. Thyroid Function Tests: No results for input(s): TSH, T4TOTAL, FREET4, T3FREE, THYROIDAB in the last 72 hours. Anemia Panel: No results for input(s): VITAMINB12, FOLATE, FERRITIN, TIBC, IRON, RETICCTPCT in the last 72 hours. Urine analysis:    Component Value Date/Time   COLORURINE YELLOW 04/16/2014 1444   APPEARANCEUR CLEAR 04/16/2014 1444   LABSPEC 1.011 04/16/2014 1444   PHURINE 5.0 04/16/2014 1444   GLUCOSEU NEGATIVE 04/16/2014 1444   HGBUR NEGATIVE 04/16/2014 1444   BILIRUBINUR NEGATIVE 04/16/2014 1444  KETONESUR NEGATIVE 04/16/2014 1444   PROTEINUR NEGATIVE 04/16/2014 1444   UROBILINOGEN 0.2 04/16/2014 1444   NITRITE NEGATIVE 04/16/2014 1444   LEUKOCYTESUR NEGATIVE 04/16/2014 1444   Sepsis Labs: @LABRCNTIP (procalcitonin:4,lacticidven:4) )No results found for this or any previous visit (from the past 240 hour(s)).   Radiological Exams on Admission: No results found.    Assessment/Plan Principal Problem:   Acute diverticulitis Active Problems:   ARF (acute renal failure) (HCC)   Macrocytic anemia    1. Colitis/acute diverticulitis with diarrhea we will keep patient on empiric antibiotics hydration clear liquid diet.  Check stool studies.  Closely monitor. 2. Acute on chronic kidney disease stage III creatinine increased from 1.6 is around one-point now.  Gently hydrate follow metabolic panel.  Likely from diarrhea and poor oral intake. 3. Hypertension uncontrolled on Toprol-XL we will keep patient n.p.o. and IV hydralazine for blood pressure trends. 4. Macrocytic anemia with history of gastric sleeve surgery previously  used to be on B12 supplements.  Will check B12 levels and anemia panel.   DVT prophylaxis: Lovenox. Code Status: Full code. Family Communication: Discussed with patient. Disposition Plan: Home. Consults called: None. Admission status: Observation.   MD [facility] Pager [phone].  If 7PM-7AM, please contact night-coverage [email or web address] Password [facility]  11/18/2020, 7:26 PM

## 2020-11-18 NOTE — Progress Notes (Signed)
Pharmacy Antibiotic Note  Vicki Liu is a 75 y.o. female admitted on 11/18/2020 with intra-abdominal infection.  Pharmacy has been consulted for Unasyn dosing.  WBC wnl, SCR 1.92  Plan: -Start Unasyn 1.5 gm IV Q 8 hours -Monitor renal fx      Temp (24hrs), Avg:98.3 F (36.8 C), Min:98.3 F (36.8 C), Max:98.3 F (36.8 C)  Recent Labs  Lab 11/18/20 1439  WBC 6.3  CREATININE 1.92*    CrCl cannot be calculated (Unknown ideal weight.).    Allergies  Allergen Reactions  . Codeine Nausea And Vomiting and Other (See Comments)    Gastritis  . Celexa [Citalopram Hydrobromide] Other (See Comments)    Caused high blood pressure.   . Levaquin [Levofloxacin] Other (See Comments)    Redness all over arms  . Nsaids Other (See Comments)    gastritis    Antimicrobials this admission: Unasyn 5/23 >>   Dose adjustments this admission:   Microbiology results:   Thank you for allowing pharmacy to be a part of this patient's care.  Vinnie Level, PharmD., BCPS, BCCCP Clinical Pharmacist Please refer to [facility] for unit-specific pharmacist

## 2020-11-18 NOTE — ED Triage Notes (Signed)
Pt her e from home with c/o diverticulitis flare up , pt had ct done out pt yesterday  pt was told to come to the Ed for fluids and antibiotics , hx of same

## 2020-11-18 NOTE — ED Provider Notes (Signed)
[facility] EMERGENCY DEPARTMENT Provider Note   CSN: [id number] Arrival date & time: 11/18/20  1332     History No chief complaint on file.   Vicki Liu is a 75 y.o. female.  75 year old female presents with several days of abdominal discomfort in her left lower quadrant consistent with her prior bouts of diverticulitis.  Patient states that she has had any fever or chills.  No vomiting.  No black or bloody stools.  Was seen by her physician today and had blood work done as well as an outpatient CT which confirmed that she have diverticulitis..  Patient noted to have elevated renal function and was sent here for admission.        Past Medical History:  Diagnosis Date  . Arthritis   . Chronic kidney disease    hx stones  . Depression   . GERD (gastroesophageal reflux disease)   . H/O hiatal hernia   . History of kidney stones     Patient Active Problem List   Diagnosis Date Noted  . Compression fracture 04/18/2014  . Radiculopathy 05/24/2013  . Spinal stenosis of lumbar region 05/24/2013    Past Surgical History:  Procedure Laterality Date  . ABDOMINAL HYSTERECTOMY    . ANTERIOR LAT LUMBAR FUSION Left 05/24/2013   Procedure: ANTERIOR LATERAL LUMBAR FUSION 3 LEVELS;  Surgeon: Emilee Hero, MD;  Location: [facility] OR;  Service: Orthopedics;  Laterality: Left;  Left sided lumbar 2-3, lumbar 3-4, lumbar 4-5 with allograft  . CAROTID ENDARTERECTOMY Bilateral 2008  . COLONOSCOPY W/ BIOPSIES AND POLYPECTOMY     benign Bx  . GASTRIC RESTRICTION SURGERY  6/12   partial gastrectomy  . HERNIA REPAIR  12   hiatal  . KYPHOPLASTY N/A 04/18/2014   Procedure: KYPHOPLASTY;  Surgeon: Emilee Hero, MD;  Location: [facility] OR;  Service: Orthopedics;  Laterality: N/A;  Thoracic 12 kyphoplasty  . TONSILLECTOMY     adenoids  . TUBAL LIGATION       OB History   No obstetric history on file.     No family history on file.  Social History   Tobacco  Use  . Smoking status: Current Every Day Smoker    Packs/day: 0.50    Years: 10.00    Pack years: 5.00    Types: Cigarettes  . Smokeless tobacco: Never Used  Substance Use Topics  . Alcohol use: No  . Drug use: No    Home Medications Prior to Admission medications   Medication Sig Start Date End Date Taking? Authorizing Provider  aspirin EC 81 MG tablet Take 81 mg by mouth daily.    [provider]  CALCIUM-VITAMIN D PO Take 3 tablets by mouth 2 (two) times daily. 1000 units of vitamin D + 1000mg  of calcium    [provider]  cholecalciferol (VITAMIN D) 1000 UNITS tablet Take 1,000 Units by mouth 2 (two) times daily.    [provider]  Cyanocobalamin (VITAMIN B-12 IJ) Inject 1 Applicatorful as directed See admin instructions. Every other month    [provider]  escitalopram (LEXAPRO) 20 MG tablet Take 20 mg by mouth daily.    [provider]  HYDROcodone-acetaminophen (NORCO/VICODIN) 5-325 MG tablet Take 1 tablet by mouth every 6 (six) hours as needed for severe pain. 03/14/20   03/16/20, MD  meclizine (ANTIVERT) 25 MG tablet Take 25 mg by mouth 2 (two) times daily as needed for dizziness.     [provider]  Multiple Vitamins-Minerals (MULTIVITAMIN WITH MINERALS) tablet Take 1 tablet by mouth 3 (three) times daily.    [provider]  omeprazole (PRILOSEC) 20 MG capsule Take 20 mg by mouth daily.    [provider]  predniSONE (DELTASONE) 50 MG tablet Take 1 tablet (50 mg total) by mouth at bedtime. 03/14/20   Zadie Rhine, MD  zolpidem (AMBIEN) 10 MG tablet Take 10 mg by mouth at bedtime as needed for sleep.    [provider]    Allergies    Codeine, Celexa [citalopram hydrobromide], Levaquin [levofloxacin], and Nsaids  Review of Systems   Review of Systems  All other systems reviewed and are negative.   Physical Exam Updated Vital Signs BP (!) 193/100   Pulse 68   Temp 98.3 F  (36.8 C) (Oral)   Resp 18   SpO2 100%   Physical Exam Vitals and nursing note reviewed.  Constitutional:      General: She is not in acute distress.    Appearance: Normal appearance. She is well-developed. She is not toxic-appearing.  HENT:     Head: Normocephalic and atraumatic.  Eyes:     General: Lids are normal.     Conjunctiva/sclera: Conjunctivae normal.     Pupils: Pupils are equal, round, and reactive to light.  Neck:     Thyroid: No thyroid mass.     Trachea: No tracheal deviation.  Cardiovascular:     Rate and Rhythm: Normal rate and regular rhythm.     Heart sounds: Normal heart sounds. No murmur heard. No gallop.   Pulmonary:     Effort: Pulmonary effort is normal. No respiratory distress.     Breath sounds: Normal breath sounds. No stridor. No decreased breath sounds, wheezing, rhonchi or rales.  Abdominal:     General: Bowel sounds are normal. There is no distension.     Palpations: Abdomen is soft.     Tenderness: There is abdominal tenderness in the left lower quadrant. There is guarding. There is no rebound.    Musculoskeletal:        General: No tenderness. Normal range of motion.     Cervical back: Normal range of motion and neck supple.  Skin:    General: Skin is warm and dry.     Findings: No abrasion or rash.  Neurological:     Mental Status: She is alert and oriented to person, place, and time.     GCS: GCS eye subscore is 4. GCS verbal subscore is 5. GCS motor subscore is 6.     Cranial Nerves: No cranial nerve deficit.     Sensory: No sensory deficit.  Psychiatric:        Speech: Speech normal.        Behavior: Behavior normal.     ED Results / Procedures / Treatments   Labs (all labs ordered are listed, but only abnormal results are displayed) Labs Reviewed  COMPREHENSIVE METABOLIC PANEL - Abnormal; Notable for the following components:      Result Value   CO2 21 (*)    Creatinine, Ser 1.92 (*)    Calcium 8.5 (*)    Total Protein 5.9  (*)    Albumin 3.3 (*)    AST 14 (*)    GFR, Estimated 27 (*)    All other components within normal limits  CBC WITH DIFFERENTIAL/PLATELET - Abnormal; Notable for the following components:   RBC 3.41 (*)    Hemoglobin 11.1 (*)    HCT  34.5 (*)    MCV 101.2 (*)    All other components within normal limits  LIPASE, BLOOD  URINALYSIS, ROUTINE W REFLEX MICROSCOPIC  POC SARS CORONAVIRUS 2 AG -  ED    EKG None  Radiology No results found.  Procedures Procedures   Medications Ordered in ED Medications  Ampicillin-Sulbactam (UNASYN) 3 g in sodium chloride 0.9 % 100 mL IVPB (has no administration in time range)    ED Course  I have reviewed the triage vital signs and the nursing notes.  Pertinent labs & imaging results that were available during my care of the patient were reviewed by me and considered in my medical decision making (see chart for details).    MDM Rules/Calculators/A&P                          Outpatient abdominal CT consistent with diverticulitis.  Patient also has acute kidney injury here with a creatinine of 1.92.  Patient will be started on IV fluids as well as IV antibiotics and will consult the hospitalist for admission Final Clinical Impression(s) / ED Diagnoses Final diagnoses:  None    Rx / DC Orders ED Discharge Orders    None       Lorre Nick, MD 11/18/20 [phone]

## 2020-11-19 ENCOUNTER — Other Ambulatory Visit: Payer: Self-pay

## 2020-11-19 DIAGNOSIS — D539 Nutritional anemia, unspecified: Secondary | ICD-10-CM

## 2020-11-19 DIAGNOSIS — Z20822 Contact with and (suspected) exposure to covid-19: Secondary | ICD-10-CM | POA: Diagnosis present

## 2020-11-19 DIAGNOSIS — K5792 Diverticulitis of intestine, part unspecified, without perforation or abscess without bleeding: Secondary | ICD-10-CM | POA: Diagnosis present

## 2020-11-19 DIAGNOSIS — F17210 Nicotine dependence, cigarettes, uncomplicated: Secondary | ICD-10-CM | POA: Diagnosis present

## 2020-11-19 DIAGNOSIS — Z87442 Personal history of urinary calculi: Secondary | ICD-10-CM | POA: Diagnosis not present

## 2020-11-19 DIAGNOSIS — K219 Gastro-esophageal reflux disease without esophagitis: Secondary | ICD-10-CM | POA: Diagnosis present

## 2020-11-19 DIAGNOSIS — Z886 Allergy status to analgesic agent status: Secondary | ICD-10-CM | POA: Diagnosis not present

## 2020-11-19 DIAGNOSIS — Z79899 Other long term (current) drug therapy: Secondary | ICD-10-CM | POA: Diagnosis not present

## 2020-11-19 DIAGNOSIS — E538 Deficiency of other specified B group vitamins: Secondary | ICD-10-CM | POA: Diagnosis present

## 2020-11-19 DIAGNOSIS — N1832 Chronic kidney disease, stage 3b: Secondary | ICD-10-CM | POA: Diagnosis present

## 2020-11-19 DIAGNOSIS — K529 Noninfective gastroenteritis and colitis, unspecified: Secondary | ICD-10-CM | POA: Diagnosis present

## 2020-11-19 DIAGNOSIS — I129 Hypertensive chronic kidney disease with stage 1 through stage 4 chronic kidney disease, or unspecified chronic kidney disease: Secondary | ICD-10-CM | POA: Diagnosis present

## 2020-11-19 DIAGNOSIS — Z7952 Long term (current) use of systemic steroids: Secondary | ICD-10-CM | POA: Diagnosis not present

## 2020-11-19 DIAGNOSIS — N179 Acute kidney failure, unspecified: Secondary | ICD-10-CM | POA: Diagnosis present

## 2020-11-19 DIAGNOSIS — Z881 Allergy status to other antibiotic agents status: Secondary | ICD-10-CM | POA: Diagnosis not present

## 2020-11-19 DIAGNOSIS — Z885 Allergy status to narcotic agent status: Secondary | ICD-10-CM | POA: Diagnosis not present

## 2020-11-19 DIAGNOSIS — Z9884 Bariatric surgery status: Secondary | ICD-10-CM | POA: Diagnosis not present

## 2020-11-19 DIAGNOSIS — Z981 Arthrodesis status: Secondary | ICD-10-CM | POA: Diagnosis not present

## 2020-11-19 DIAGNOSIS — Z7982 Long term (current) use of aspirin: Secondary | ICD-10-CM | POA: Diagnosis not present

## 2020-11-19 DIAGNOSIS — F32A Depression, unspecified: Secondary | ICD-10-CM | POA: Diagnosis present

## 2020-11-19 LAB — HEPATIC FUNCTION PANEL
ALT: 11 U/L (ref 0–44)
AST: 13 U/L — ABNORMAL LOW (ref 15–41)
Albumin: 2.9 g/dL — ABNORMAL LOW (ref 3.5–5.0)
Alkaline Phosphatase: 60 U/L (ref 38–126)
Bilirubin, Direct: <0.1 mg/dL (ref 0.0–0.2)
Total Bilirubin: 0.6 mg/dL (ref 0.3–1.2)
Total Protein: 5.2 g/dL — ABNORMAL LOW (ref 6.5–8.1)

## 2020-11-19 LAB — BASIC METABOLIC PANEL
Anion gap: 7 (ref 5–15)
BUN: 18 mg/dL (ref 8–23)
CO2: 21 mmol/L — ABNORMAL LOW (ref 22–32)
Calcium: 8.5 mg/dL — ABNORMAL LOW (ref 8.9–10.3)
Chloride: 111 mmol/L (ref 98–111)
Creatinine, Ser: 1.68 mg/dL — ABNORMAL HIGH (ref 0.44–1.00)
GFR, Estimated: 32 mL/min — ABNORMAL LOW (ref >60)
Glucose, Bld: 92 mg/dL (ref 70–99)
Potassium: 3.7 mmol/L (ref 3.5–5.1)
Sodium: 139 mmol/L (ref 135–145)

## 2020-11-19 LAB — RETICULOCYTES
Immature Retic Fract: 8.9 % (ref 2.3–15.9)
RBC.: 3.3 MIL/uL — ABNORMAL LOW (ref 3.87–5.11)
Retic Count, Absolute: 44.2 10*3/uL (ref 19.0–186.0)
Retic Ct Pct: 1.3 % (ref 0.4–3.1)

## 2020-11-19 LAB — C DIFFICILE QUICK SCREEN W PCR REFLEX
C Diff antigen: NEGATIVE
C Diff interpretation: NOT DETECTED
C Diff toxin: NEGATIVE

## 2020-11-19 LAB — FOLATE: Folate: 16.8 ng/mL (ref >5.9)

## 2020-11-19 LAB — SARS CORONAVIRUS 2 (TAT 6-24 HRS): SARS Coronavirus 2: NEGATIVE

## 2020-11-19 LAB — IRON AND TIBC
Iron: 51 ug/dL (ref 28–170)
Saturation Ratios: 16 % (ref 10.4–31.8)
TIBC: 326 ug/dL (ref 250–450)
UIBC: 275 ug/dL

## 2020-11-19 LAB — FERRITIN: Ferritin: 27 ng/mL (ref 11–307)

## 2020-11-19 LAB — VITAMIN B12: Vitamin B-12: 98 pg/mL — ABNORMAL LOW (ref 180–914)

## 2020-11-19 LAB — CBC
HCT: 31.9 % — ABNORMAL LOW (ref 36.0–46.0)
Hemoglobin: 10.4 g/dL — ABNORMAL LOW (ref 12.0–15.0)
MCH: 32.3 pg (ref 26.0–34.0)
MCHC: 32.6 g/dL (ref 30.0–36.0)
MCV: 99.1 fL (ref 80.0–100.0)
Platelets: 209 10*3/uL (ref 150–400)
RBC: 3.22 MIL/uL — ABNORMAL LOW (ref 3.87–5.11)
RDW: 13.9 % (ref 11.5–15.5)
WBC: 5.5 10*3/uL (ref 4.0–10.5)
nRBC: 0 % (ref 0.0–0.2)

## 2020-11-19 MED ORDER — EZETIMIBE 10 MG PO TABS
10.0000 mg | ORAL_TABLET | Freq: Every day | ORAL | Status: DC
  Administered 2020-11-20: 10 mg via ORAL
  Filled 2020-11-19: qty 1

## 2020-11-19 MED ORDER — METOPROLOL SUCCINATE ER 25 MG PO TB24
50.0000 mg | ORAL_TABLET | Freq: Every day | ORAL | Status: DC
  Administered 2020-11-19 – 2020-11-20 (×2): 50 mg via ORAL
  Filled 2020-11-19 (×2): qty 2

## 2020-11-19 MED ORDER — ONDANSETRON HCL 4 MG/2ML IJ SOLN
4.0000 mg | Freq: Four times a day (QID) | INTRAMUSCULAR | Status: DC | PRN
  Administered 2020-11-19: 4 mg via INTRAVENOUS
  Filled 2020-11-19: qty 2

## 2020-11-19 MED ORDER — CYANOCOBALAMIN 1000 MCG/ML IJ SOLN
1000.0000 ug | INTRAMUSCULAR | Status: DC
  Administered 2020-11-20: 1000 ug via INTRAMUSCULAR
  Filled 2020-11-19: qty 1

## 2020-11-19 MED ORDER — HYDRALAZINE HCL 20 MG/ML IJ SOLN
20.0000 mg | Freq: Once | INTRAMUSCULAR | Status: AC
  Administered 2020-11-19: 20 mg via INTRAVENOUS
  Filled 2020-11-19: qty 1

## 2020-11-19 MED ORDER — SERTRALINE HCL 50 MG PO TABS
75.0000 mg | ORAL_TABLET | Freq: Every day | ORAL | Status: DC
  Administered 2020-11-20: 75 mg via ORAL
  Filled 2020-11-19: qty 2

## 2020-11-19 MED ORDER — ZOLPIDEM TARTRATE 5 MG PO TABS
5.0000 mg | ORAL_TABLET | Freq: Every evening | ORAL | Status: DC | PRN
  Administered 2020-11-19: 5 mg via ORAL
  Filled 2020-11-19: qty 1

## 2020-11-19 MED ORDER — GABAPENTIN 100 MG PO CAPS
200.0000 mg | ORAL_CAPSULE | Freq: Three times a day (TID) | ORAL | Status: DC
  Administered 2020-11-19 – 2020-11-20 (×3): 200 mg via ORAL
  Filled 2020-11-19 (×3): qty 2

## 2020-11-19 MED ORDER — LACTATED RINGERS IV BOLUS
250.0000 mL | Freq: Once | INTRAVENOUS | Status: AC
  Administered 2020-11-19: 250 mL via INTRAVENOUS

## 2020-11-19 MED ORDER — CALCITRIOL 0.25 MCG PO CAPS
0.2500 ug | ORAL_CAPSULE | Freq: Every day | ORAL | Status: DC
  Administered 2020-11-20: 0.25 ug via ORAL
  Filled 2020-11-19: qty 1

## 2020-11-19 MED ORDER — HYDRALAZINE HCL 20 MG/ML IJ SOLN
10.0000 mg | INTRAMUSCULAR | Status: DC | PRN
  Administered 2020-11-19 – 2020-11-20 (×2): 10 mg via INTRAVENOUS
  Filled 2020-11-19 (×3): qty 1

## 2020-11-19 MED ORDER — METOPROLOL SUCCINATE ER 25 MG PO TB24: 50.0000 mg | ORAL_TABLET | Freq: Every day | ORAL | Status: DC

## 2020-11-19 MED ORDER — ALLOPURINOL 300 MG PO TABS
150.0000 mg | ORAL_TABLET | Freq: Every day | ORAL | Status: DC
  Administered 2020-11-20: 150 mg via ORAL
  Filled 2020-11-19: qty 1

## 2020-11-19 MED ORDER — AMLODIPINE BESYLATE 10 MG PO TABS
10.0000 mg | ORAL_TABLET | Freq: Every day | ORAL | Status: DC
  Administered 2020-11-19 – 2020-11-20 (×2): 10 mg via ORAL
  Filled 2020-11-19 (×2): qty 1

## 2020-11-19 NOTE — Progress Notes (Signed)
PROGRESS NOTE    Vicki Liu  NWG:[id number] DOB: 17-Sep-1945 DOA: 11/18/2020 PCP: Charolett Bumpers, PA-C     No chief complaint on file.   Brief Narrative:   Vicki Liu is a 75 y.o. female with history of hypertension, chronic disease stage III, anemia, history of gastric sleeve surgery has been experiencing diarrhea about 4 days ago followed with abdominal pain mostly in the left lower quadrant had followed up with primary care physician had ordered a CT abdomen pelvis which showed features concerning for colitis and was referred to the ER.  Patient states whenever she eats something her abdominal pain increases and she gets diarrhea.  Diarrhea is usually watery and nonbloody.  Has subjective feeling of fever chills.  Patient states she had an attack of diverticulitis in March and was admitted at [facility] at the time she took antibiotics.  Last colonoscopy was around 8 years ago, On exam in the ER pain is mostly in the left lower quadrant.  Labs show macrocytic anemia with hemoglobin of 11.1 creatinine 1.9 baseline is around 1.6 LFTs are largely unremarkable.  COVID test is negative.  Patient started on empiric antibiotics IV fluids admitted for further management of colitis/diverticulitis with diarrhea.  Assessment & Plan:   Principal Problem:   Acute diverticulitis Active Problems:   ARF (acute renal failure) (HCC)   Macrocytic anemia  Colitis/acute diverticulitis with diarrhea  -This is likely recurrent diverticulitis, will keep on clear liquid diet, keep on IV fluids, will follow on GI panel as well . -C. difficile is negative .  Acute on chronic kidney disease stage III  -Creatinine 1.6, it is 1.9 on admission, continue with IV fluids, avoid nephrotoxic medications.    Hypertension  - uncontrolled on Toprol-XL, will add Norvasc and keep on as needed hydralazin  Macrocytic anemia with history of gastric sleeve surgery previously used to be on B12 supplements.     Work-up significant for  low B12 level at 98, she will be started on IM supplement.  DVT prophylaxis: [place] heparin Code Status: Full Family Communication: daughter at bedside Disposition:   Status is: Observation  The patient will require care spanning > 2 midnights and should be moved to inpatient because: IV treatments appropriate due to intensity of illness or inability to take PO  Dispo: The patient is from: Home              Anticipated d/c is to: Home              Patient currently is not medically stable to d/c.   Difficult to place patient No       Consultants:   None   Subjective:  Reports generalized weakness, some mild nausea, but no vomiting, still reports some abdominal vein,  Objective: Vitals:   11/18/20 1752 11/18/20 2138 11/19/20 0052 11/19/20 0538  BP: (!) 193/100 (!) 190/88 (!) 188/90 (!) 189/76  Pulse: 68 68 66 67  Resp: 18 17 18 18   Temp:  98.4 F (36.9 C) 98.2 F (36.8 C) 97.7 F (36.5 C)  TempSrc:  Oral Oral Oral  SpO2: 100% 99% 96% 97%    Intake/Output Summary (Last 24 hours) at 11/19/2020 1504 Last data filed at 11/19/2020 11/21/2020 Gross per 24 hour  Intake 828.96 ml  Output --  Net 828.96 ml   There were no vitals filed for this visit.  Examination:  General exam: Appears calm and comfortable  Respiratory system: Clear to auscultation. Respiratory effort normal. Cardiovascular system:  S1 & S2 heard, RRR. No JVD, murmurs, rubs, gallops or clicks. No pedal edema. Gastrointestinal system: Abdomen is nondistended, diffuse abdominal tenderness. No organomegaly or masses felt. Normal bowel sounds heard. Central nervous system: Alert and oriented. No focal neurological deficits. Extremities: Symmetric 5 x 5 power. Skin: No rashes, lesions or ulcers Psychiatry: Judgement and insight appear normal. Mood & affect appropriate.     Data Reviewed: I have personally reviewed following labs and imaging studies  CBC: Recent Labs  Lab  11/18/20 1439 11/19/20 0225  WBC 6.3 5.5  NEUTROABS 4.0  --   HGB 11.1* 10.4*  HCT 34.5* 31.9*  MCV 101.2* 99.1  PLT 222 209    Basic Metabolic Panel: Recent Labs  Lab 11/18/20 1439 11/19/20 0225  NA 139 139  K 3.9 3.7  CL 109 111  CO2 21* 21*  GLUCOSE 92 92  BUN 21 18  CREATININE 1.92* 1.68*  CALCIUM 8.5* 8.5*    GFR: CrCl cannot be calculated (Unknown ideal weight.).  Liver Function Tests: Recent Labs  Lab 11/18/20 1439 11/19/20 0225  AST 14* 13*  ALT 11 11  ALKPHOS 70 60  BILITOT 0.5 0.6  PROT 5.9* 5.2*  ALBUMIN 3.3* 2.9*    CBG: No results for input(s): GLUCAP in the last 168 hours.   Recent Results (from the past 240 hour(s))  SARS CORONAVIRUS 2 (TAT 6-24 HRS) Nasopharyngeal Nasopharyngeal Swab     Status: None   Collection Time: 11/18/20  7:17 PM   Specimen: Nasopharyngeal Swab  Result Value Ref Range Status   SARS Coronavirus 2 NEGATIVE NEGATIVE Final    Comment: (NOTE) SARS-CoV-2 target nucleic acids are NOT DETECTED.  The SARS-CoV-2 RNA is generally detectable in upper and lower respiratory specimens during the acute phase of infection. Negative results do not preclude SARS-CoV-2 infection, do not rule out co-infections with other pathogens, and should not be used as the sole basis for treatment or other patient management decisions. Negative results must be combined with clinical observations, patient history, and epidemiological information. The expected result is Negative.  Fact Sheet for Patients: HairSlick.no  Fact Sheet for Healthcare Providers: quierodirigir.com  This test is not yet approved or cleared by the [place] FDA and  has been authorized for detection and/or diagnosis of SARS-CoV-2 by FDA under an Emergency Use Authorization (EUA). This EUA will remain  in effect (meaning this test can be used) for the duration of the COVID-19 declaration under Se ction  564(b)(1) of the Act, 21 U.S.C. section 360bbb-3(b)(1), unless the authorization is terminated or revoked sooner.  Performed at [facility], [address] [place], [place] [address]   C Difficile Quick Screen w PCR reflex     Status: None   Collection Time: 11/19/20  9:50 AM   Specimen: Stool  Result Value Ref Range Status   C Diff antigen NEGATIVE NEGATIVE Final   C Diff toxin NEGATIVE NEGATIVE Final   C Diff interpretation No C. difficile detected.  Final    Comment: Performed at [facility], [address], [place], [place] [address]         Radiology Studies: No results found.      Scheduled Meds: . heparin  5,000 Units Subcutaneous Q8H  . metoprolol succinate  50 mg Oral Daily   Continuous Infusions: . ampicillin-sulbactam (UNASYN) IV 1.5 g (11/19/20 1340)  . lactated ringers    . lactated ringers 125 mL/hr at 11/19/20 0814     LOS:  0 days      Huey Bienenstock, MD [facility]   To contact the attending provider between 7A-7P or the covering provider during after hours 7P-7A, please log into the web site [email or web address] and access using universal [place] password for that web site. If you do not have the password, please call the hospital operator.  11/19/2020, 3:04 PM

## 2020-11-19 NOTE — Plan of Care (Signed)
Patient arrived on unit form ED. Alert, oriented and without distress. Settled into room, admission assessment completed. Will continue to monitor according to orders/plan of care.

## 2020-11-20 DIAGNOSIS — K5792 Diverticulitis of intestine, part unspecified, without perforation or abscess without bleeding: Secondary | ICD-10-CM | POA: Diagnosis not present

## 2020-11-20 LAB — GASTROINTESTINAL PANEL BY PCR, STOOL (REPLACES STOOL CULTURE)

## 2020-11-20 LAB — CBC
HCT: 35.4 % — ABNORMAL LOW (ref 36.0–46.0)
Hemoglobin: 11.6 g/dL — ABNORMAL LOW (ref 12.0–15.0)
MCH: 32 pg (ref 26.0–34.0)
MCHC: 32.8 g/dL (ref 30.0–36.0)
MCV: 97.5 fL (ref 80.0–100.0)
Platelets: 243 10*3/uL (ref 150–400)
RBC: 3.63 MIL/uL — ABNORMAL LOW (ref 3.87–5.11)
RDW: 14 % (ref 11.5–15.5)
WBC: 5.9 10*3/uL (ref 4.0–10.5)
nRBC: 0 % (ref 0.0–0.2)

## 2020-11-20 LAB — BASIC METABOLIC PANEL
Anion gap: 9 (ref 5–15)
BUN: 12 mg/dL (ref 8–23)
CO2: 24 mmol/L (ref 22–32)
Calcium: 8.9 mg/dL (ref 8.9–10.3)
Chloride: 108 mmol/L (ref 98–111)
Creatinine, Ser: 1.36 mg/dL — ABNORMAL HIGH (ref 0.44–1.00)
GFR, Estimated: 41 mL/min — ABNORMAL LOW (ref >60)
Glucose, Bld: 98 mg/dL (ref 70–99)
Potassium: 3.3 mmol/L — ABNORMAL LOW (ref 3.5–5.1)
Sodium: 141 mmol/L (ref 135–145)

## 2020-11-20 MED ORDER — AMOXICILLIN-POT CLAVULANATE 875-125 MG PO TABS: 1.0000 | ORAL_TABLET | Freq: Two times a day (BID) | ORAL | 0 refills | Status: AC

## 2020-11-20 MED ORDER — POTASSIUM CHLORIDE CRYS ER 20 MEQ PO TBCR
40.0000 meq | EXTENDED_RELEASE_TABLET | Freq: Once | ORAL | Status: AC
  Administered 2020-11-20: 40 meq via ORAL
  Filled 2020-11-20: qty 2

## 2020-11-20 NOTE — Plan of Care (Signed)

## 2020-11-20 NOTE — Discharge Summary (Signed)
Physician Discharge Summary  Vicki Liu FAO:[id number] DOB: 02-27-1946 DOA: 11/18/2020  PCP: Charolett Bumpers, PA-C  Admit date: 11/18/2020 Discharge date: 11/20/2020  Admitted From: Home Disposition: Home  Recommendations for Outpatient Follow-up:  1. Follow up with PCP in 1 week 2. Please obtain BMP/CBC in one week 3. Recommend Vitamin B12 IM injections q weekly 4. Please follow up on the following pending results: none  Home Health: None Equipment/Devices: None  Discharge Condition: Stable CODE STATUS: Full code Diet recommendation: Soft diet   Brief/Interim Summary:  Admission HPI written by Starleen Arms, MD   HPI: Vicki Liu is a 75 y.o. female with history of hypertension, chronic disease stage III, anemia, history of gastric sleeve surgery has been experiencing diarrhea about 4 days ago followed with abdominal pain mostly in the left lower quadrant had followed up with primary care physician had ordered a CT abdomen pelvis which showed features concerning for colitis and was referred to the ER.  Patient states whenever she eats something her abdominal pain increases and she gets diarrhea.  Diarrhea is usually watery and nonbloody.  Has subjective feeling of fever chills.  Patient states she had an attack of diverticulitis in March and was admitted at [facility] at the time she took antibiotics.  Last colonoscopy was around 8 years ago.   Hospital course:  Acute diverticulitis Recurrent diverticulitis. Patient started empirically on Unasyn and transitioned to Augmentin on discharge for10 days.  AKI on CKD stage IIIb Baseline creatinine of 1.3-1.6. Creatinine of 1.9 on admission. Given IV fluids with improvement to 1.36 prior to discahrge.  Primary hypertension Continue metoprolol.  Macrocytic anemia In setting of gastric sleeve and vitamin B12 deficiency. Given Vitamin B12 IM x1 on 5/25 while inpatient. Recommend continued vitamin B12 IM injection with  rechecking Vitamin B12 as an outpatient.  Vitamin B12 deficiency Vitamin B12 of 98. As mentioned above.  Discharge Diagnoses:  Principal Problem:   Acute diverticulitis Active Problems:   ARF (acute renal failure) (HCC)   Macrocytic anemia    Discharge Instructions  Discharge Instructions    Call MD for:  severe uncontrolled pain   Complete by: As directed    Call MD for:  temperature >100.4   Complete by: As directed    Increase activity slowly   Complete by: As directed      Allergies as of 11/20/2020      Reactions   Codeine Nausea And Vomiting, Other (See Comments)   Gastritis   Celexa [citalopram Hydrobromide] Other (See Comments)   Caused high blood pressure.    Levaquin [levofloxacin] Other (See Comments)   Redness all over arms   Nsaids Other (See Comments)   gastritis      Medication List    TAKE these medications   allopurinol 100 MG tablet Commonly known as: ZYLOPRIM Take 150 mg by mouth daily.   amoxicillin-clavulanate 875-125 MG tablet Commonly known as: Augmentin Take 1 tablet by mouth 2 (two) times daily for 10 days.   aspirin EC 81 MG tablet Take 81 mg by mouth daily.   calcitRIOL 0.25 MCG capsule Commonly known as: ROCALTROL Take 0.25 mcg by mouth daily.   CALCIUM-VITAMIN D PO Take 3 tablets by mouth 2 (two) times daily. 1000 units of vitamin D + 1000mg  of calcium   cholecalciferol 1000 units tablet Commonly known as: VITAMIN D Take 1,000 Units by mouth 2 (two) times daily.   ezetimibe 10 MG tablet Commonly known as: ZETIA Take 10  mg by mouth daily.   gabapentin 100 MG capsule Commonly known as: NEURONTIN Take 200 mg by mouth 3 (three) times daily.   meclizine 25 MG tablet Commonly known as: ANTIVERT Take 25 mg by mouth 2 (two) times daily as needed for dizziness.   metoprolol succinate 50 MG 24 hr tablet Commonly known as: TOPROL-XL Take 50 mg by mouth daily. Take with or immediately following a meal.   multivitamin with  minerals tablet Take 1 tablet by mouth 3 (three) times daily.   omeprazole 20 MG capsule Commonly known as: PRILOSEC Take 20 mg by mouth daily.   sertraline 50 MG tablet Commonly known as: ZOLOFT Take 75 mg by mouth daily.   VITAMIN B-12 IJ Inject 1 Applicatorful as directed See admin instructions. Every other month   zolpidem 10 MG tablet Commonly known as: AMBIEN Take 10 mg by mouth at bedtime as needed for sleep.       Follow-up Information    Charolett BumpersDoyle, Patricia K, PA-C. Schedule an appointment as soon as possible for a visit in 1 week(s).   Specialty: Physician Assistant Why: Hospital follow-up Contact information: [id number] [place] [id number] [phone]              Allergies  Allergen Reactions  . Codeine Nausea And Vomiting and Other (See Comments)    Gastritis  . Celexa [Citalopram Hydrobromide] Other (See Comments)    Caused high blood pressure.   . Levaquin [Levofloxacin] Other (See Comments)    Redness all over arms  . Nsaids Other (See Comments)    gastritis    Consultations:  None   Procedures/Studies:  No results found.   Subjective: No issues. Feels very good today. Pain is significantly improved.  Discharge Exam: Vitals:   11/20/20 0448 11/20/20 0930  BP: (!) 143/73 123/67  Pulse: 85 91  Resp: 17   Temp: 98.4 F (36.9 C) 97.9 F (36.6 C)  SpO2: 97% 100%   Vitals:   11/20/20 0257 11/20/20 0345 11/20/20 0448 11/20/20 0930  BP: (!) 173/93 (!) 158/88 (!) 143/73 123/67  Pulse: 85 82 85 91  Resp: 18  17   Temp: 98.4 F (36.9 C)  98.4 F (36.9 C) 97.9 F (36.6 C)  TempSrc: Oral  Oral Oral  SpO2: 98%  97% 100%    General: Pt is alert, awake, not in acute distress Cardiovascular: RRR, S1/S2 +, no rubs, no gallops Respiratory: CTA bilaterally, no wheezing, no rhonchi Abdominal: Soft, NT, ND, bowel sounds + Extremities: no edema, no cyanosis    The results of significant diagnostics from this  hospitalization (including imaging, microbiology, ancillary and laboratory) are listed below for reference.     Microbiology: Recent Results (from the past 240 hour(s))  SARS CORONAVIRUS 2 (TAT 6-24 HRS) Nasopharyngeal Nasopharyngeal Swab     Status: None   Collection Time: 11/18/20  7:17 PM   Specimen: Nasopharyngeal Swab  Result Value Ref Range Status   SARS Coronavirus 2 NEGATIVE NEGATIVE Final    Comment: (NOTE) SARS-CoV-2 target nucleic acids are NOT DETECTED.  The SARS-CoV-2 RNA is generally detectable in upper and lower respiratory specimens during the acute phase of infection. Negative results do not preclude SARS-CoV-2 infection, do not rule out co-infections with other pathogens, and should not be used as the sole basis for treatment or other patient management decisions. Negative results must be combined with clinical observations, patient history, and epidemiological information. The expected result is Negative.  Fact Sheet  for Patients: HairSlick.no  Fact Sheet for Healthcare Providers: quierodirigir.com  This test is not yet approved or cleared by the [place] FDA and  has been authorized for detection and/or diagnosis of SARS-CoV-2 by FDA under an Emergency Use Authorization (EUA). This EUA will remain  in effect (meaning this test can be used) for the duration of the COVID-19 declaration under Se ction 564(b)(1) of the Act, 21 U.S.C. section 360bbb-3(b)(1), unless the authorization is terminated or revoked sooner.  Performed at [facility], [address] [place], [place] [address]   Gastrointestinal Panel by PCR , Stool     Status: None   Collection Time: 11/19/20  3:45 AM   Specimen: Stool  Result Value Ref Range Status   Campylobacter species NOT DETECTED NOT DETECTED Final   Plesimonas shigelloides NOT DETECTED NOT DETECTED Final   Salmonella species NOT DETECTED NOT DETECTED Final    Yersinia enterocolitica NOT DETECTED NOT DETECTED Final   Vibrio species NOT DETECTED NOT DETECTED Final   Vibrio cholerae NOT DETECTED NOT DETECTED Final   Enteroaggregative E coli (EAEC) NOT DETECTED NOT DETECTED Final   Enteropathogenic E coli (EPEC) NOT DETECTED NOT DETECTED Final   Enterotoxigenic E coli (ETEC) NOT DETECTED NOT DETECTED Final   Shiga like toxin producing E coli (STEC) NOT DETECTED NOT DETECTED Final   Shigella/Enteroinvasive E coli (EIEC) NOT DETECTED NOT DETECTED Final   Cryptosporidium NOT DETECTED NOT DETECTED Final   Cyclospora cayetanensis NOT DETECTED NOT DETECTED Final   Entamoeba histolytica NOT DETECTED NOT DETECTED Final   Giardia lamblia NOT DETECTED NOT DETECTED Final   Adenovirus F40/41 NOT DETECTED NOT DETECTED Final   Astrovirus NOT DETECTED NOT DETECTED Final   Norovirus GI/GII NOT DETECTED NOT DETECTED Final   Rotavirus A NOT DETECTED NOT DETECTED Final   Sapovirus (I, II, IV, and V) NOT DETECTED NOT DETECTED Final    Comment: Performed at [facility], [address] [place], [place] [address]  C Difficile Quick Screen w PCR reflex     Status: None   Collection Time: 11/19/20  9:50 AM   Specimen: Stool  Result Value Ref Range Status   C Diff antigen NEGATIVE NEGATIVE Final   C Diff toxin NEGATIVE NEGATIVE Final   C Diff interpretation No C. difficile detected.  Final    Comment: Performed at [facility], [address], [place], [place] [address]     Labs: BNP (last 3 results) No results for input(s): BNP in the last 8760 hours. Basic Metabolic Panel: Recent Labs  Lab 11/18/20 1439 11/19/20 0225 11/20/20 0303  NA 139 139 141  K 3.9 3.7 3.3*  CL 109 111 108  CO2 21* 21* 24  GLUCOSE 92 92 98  BUN 21 18 12   CREATININE 1.92* 1.68* 1.36*  CALCIUM 8.5* 8.5* 8.9   Liver Function Tests: Recent Labs  Lab 11/18/20 1439 11/19/20 0225  AST 14* 13*  ALT 11 11  ALKPHOS 70 60  BILITOT 0.5 0.6  PROT 5.9* 5.2*  ALBUMIN  3.3* 2.9*   Recent Labs  Lab 11/18/20 1439  LIPASE 22   No results for input(s): AMMONIA in the last 168 hours. CBC: Recent Labs  Lab 11/18/20 1439 11/19/20 0225 11/20/20 0303  WBC 6.3 5.5 5.9  NEUTROABS 4.0  --   --   HGB 11.1* 10.4* 11.6*  HCT 34.5* 31.9* 35.4*  MCV 101.2* 99.1 97.5  PLT 222 209 243   Cardiac Enzymes: No results  for input(s): CKTOTAL, CKMB, CKMBINDEX, TROPONINI in the last 168 hours. BNP: Invalid input(s): POCBNP CBG: No results for input(s): GLUCAP in the last 168 hours. D-Dimer No results for input(s): DDIMER in the last 72 hours. Hgb A1c No results for input(s): HGBA1C in the last 72 hours. Lipid Profile No results for input(s): CHOL, HDL, LDLCALC, TRIG, CHOLHDL, LDLDIRECT in the last 72 hours. Thyroid function studies No results for input(s): TSH, T4TOTAL, T3FREE, THYROIDAB in the last 72 hours.  Invalid input(s): FREET3 Anemia work up Recent Labs    11/19/20 0528  VITAMINB12 98*  FOLATE 16.8  FERRITIN 27  TIBC 326  IRON 51  RETICCTPCT 1.3   Urinalysis    Component Value Date/Time   COLORURINE YELLOW 11/18/2020 1823   APPEARANCEUR CLEAR 11/18/2020 1823   LABSPEC 1.014 11/18/2020 1823   PHURINE 5.0 11/18/2020 1823   GLUCOSEU NEGATIVE 11/18/2020 1823   HGBUR NEGATIVE 11/18/2020 1823   BILIRUBINUR NEGATIVE 11/18/2020 1823   KETONESUR NEGATIVE 11/18/2020 1823   PROTEINUR NEGATIVE 11/18/2020 1823   UROBILINOGEN 0.2 04/16/2014 1444   NITRITE NEGATIVE 11/18/2020 1823   LEUKOCYTESUR MODERATE (A) 11/18/2020 1823   Sepsis Labs Invalid input(s): PROCALCITONIN,  WBC,  LACTICIDVEN Microbiology Recent Results (from the past 240 hour(s))  SARS CORONAVIRUS 2 (TAT 6-24 HRS) Nasopharyngeal Nasopharyngeal Swab     Status: None   Collection Time: 11/18/20  7:17 PM   Specimen: Nasopharyngeal Swab  Result Value Ref Range Status   SARS Coronavirus 2 NEGATIVE NEGATIVE Final    Comment: (NOTE) SARS-CoV-2 target nucleic acids are NOT  DETECTED.  The SARS-CoV-2 RNA is generally detectable in upper and lower respiratory specimens during the acute phase of infection. Negative results do not preclude SARS-CoV-2 infection, do not rule out co-infections with other pathogens, and should not be used as the sole basis for treatment or other patient management decisions. Negative results must be combined with clinical observations, patient history, and epidemiological information. The expected result is Negative.  Fact Sheet for Patients: HairSlick.no  Fact Sheet for Healthcare Providers: quierodirigir.com  This test is not yet approved or cleared by the [place] FDA and  has been authorized for detection and/or diagnosis of SARS-CoV-2 by FDA under an Emergency Use Authorization (EUA). This EUA will remain  in effect (meaning this test can be used) for the duration of the COVID-19 declaration under Se ction 564(b)(1) of the Act, 21 U.S.C. section 360bbb-3(b)(1), unless the authorization is terminated or revoked sooner.  Performed at [facility], [address], [place], [place] [address]   Gastrointestinal Panel by PCR , Stool     Status: None   Collection Time: 11/19/20  3:45 AM   Specimen: Stool  Result Value Ref Range Status   Campylobacter species NOT DETECTED NOT DETECTED Final   Plesimonas shigelloides NOT DETECTED NOT DETECTED Final   Salmonella species NOT DETECTED NOT DETECTED Final   Yersinia enterocolitica NOT DETECTED NOT DETECTED Final   Vibrio species NOT DETECTED NOT DETECTED Final   Vibrio cholerae NOT DETECTED NOT DETECTED Final   Enteroaggregative E coli (EAEC) NOT DETECTED NOT DETECTED Final   Enteropathogenic E coli (EPEC) NOT DETECTED NOT DETECTED Final   Enterotoxigenic E coli (ETEC) NOT DETECTED NOT DETECTED Final   Shiga like toxin producing E coli (STEC) NOT DETECTED NOT DETECTED Final   Shigella/Enteroinvasive E coli (EIEC)  NOT DETECTED NOT DETECTED Final   Cryptosporidium NOT DETECTED NOT DETECTED Final   Cyclospora cayetanensis NOT DETECTED NOT DETECTED Final  Entamoeba histolytica NOT DETECTED NOT DETECTED Final   Giardia lamblia NOT DETECTED NOT DETECTED Final   Adenovirus F40/41 NOT DETECTED NOT DETECTED Final   Astrovirus NOT DETECTED NOT DETECTED Final   Norovirus GI/GII NOT DETECTED NOT DETECTED Final   Rotavirus A NOT DETECTED NOT DETECTED Final   Sapovirus (I, II, IV, and V) NOT DETECTED NOT DETECTED Final    Comment: Performed at [facility], [address] [place], [place] [address]  C Difficile Quick Screen w PCR reflex     Status: None   Collection Time: 11/19/20  9:50 AM   Specimen: Stool  Result Value Ref Range Status   C Diff antigen NEGATIVE NEGATIVE Final   C Diff toxin NEGATIVE NEGATIVE Final   C Diff interpretation No C. difficile detected.  Final    Comment: Performed at [facility], [address] [place], [place] [address]     Time coordinating discharge: 35 minutes  SIGNED:   Jacquelin Hawking, MD [facility] 11/20/2020, 2:40 PM

## 2020-11-20 NOTE — Discharge Instructions (Signed)
Vicki Liu,  You are in the hospital with improvement of your symptoms. because of diverticulitis.  You are given IV antibiotics.  Please continue antibiotics as an outpatient as directed.  While you are here, you are also noted to have low vitamin B12 likely related to your previous gastric surgery.  Recommend they follow-up with your primary care physician to have weekly B12 injections with B12 follow-up as an outpatient.

## 2021-09-26 ENCOUNTER — Other Ambulatory Visit: Payer: Self-pay | Admitting: Orthopedic Surgery

## 2021-09-26 DIAGNOSIS — M545 Low back pain, unspecified: Secondary | ICD-10-CM

## 2021-10-06 ENCOUNTER — Other Ambulatory Visit (HOSPITAL_COMMUNITY): Payer: Self-pay | Admitting: Orthopedic Surgery

## 2021-10-06 ENCOUNTER — Other Ambulatory Visit: Payer: Self-pay | Admitting: Orthopedic Surgery

## 2021-10-06 DIAGNOSIS — M546 Pain in thoracic spine: Secondary | ICD-10-CM

## 2021-10-06 DIAGNOSIS — M545 Low back pain, unspecified: Secondary | ICD-10-CM

## 2021-10-07 ENCOUNTER — Ambulatory Visit (HOSPITAL_COMMUNITY)
Admission: RE | Admit: 2021-10-07 | Discharge: 2021-10-07 | Disposition: A | Discharge status: Home / Self Care | Payer: Medicare Other | Source: Ambulatory Visit | Attending: Orthopedic Surgery | Admitting: Orthopedic Surgery

## 2021-10-07 ENCOUNTER — Other Ambulatory Visit (HOSPITAL_COMMUNITY): Payer: Self-pay | Admitting: Orthopedic Surgery

## 2021-10-07 DIAGNOSIS — M545 Low back pain, unspecified: Secondary | ICD-10-CM | POA: Insufficient documentation

## 2021-10-07 DIAGNOSIS — M546 Pain in thoracic spine: Secondary | ICD-10-CM | POA: Diagnosis present

## 2021-10-07 IMAGING — MR MR LUMBAR SPINE W/O CM
4 of 5 series · 18 of 48 positions shown · non-contrast
Comparison: Thoracolumbar x-rays [DATE]. MRI thoracic lumbar
spine report [DATE]

CLINICAL DATA: Low back pain

EXAM:
MRI THORACIC AND LUMBAR SPINE WITHOUT CONTRAST
TECHNIQUE: Multiplanar and multiecho pulse sequences of the thoracic and lumbar
spine were obtained without intravenous contrast.

[Series 11: T2 · sagittal · 4.0mm · 0.55mm/px · 6 of 16 slices shown (1 of 2)]
[im 1/16]
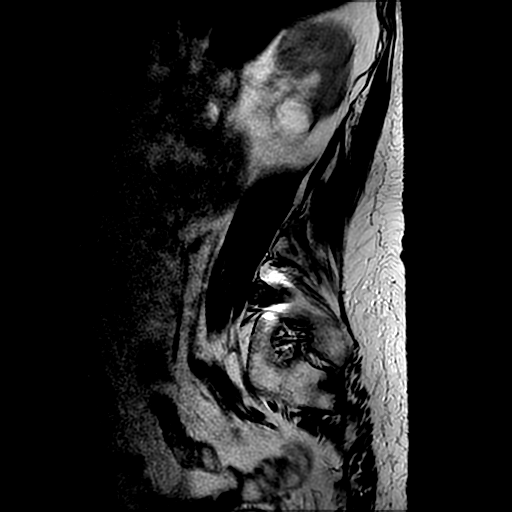
[im 4/16]
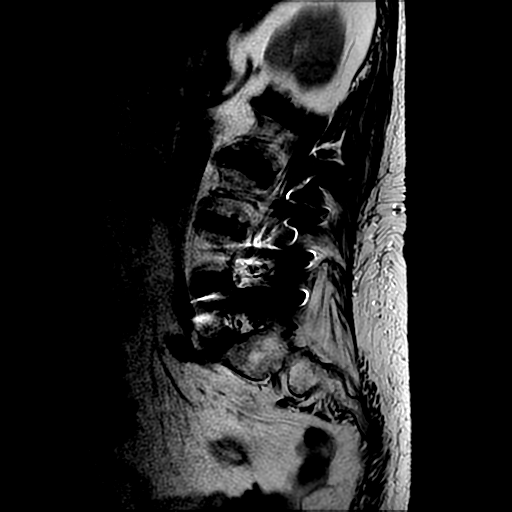
[im 7/16]
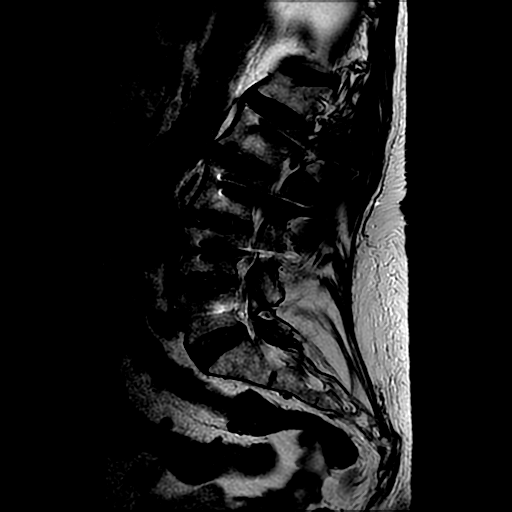
[im 10/16]
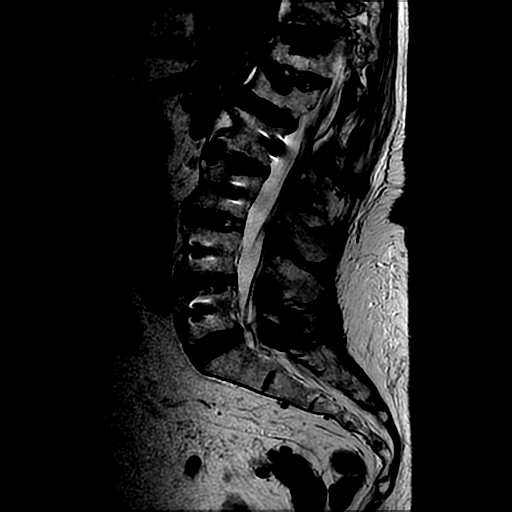
[im 13/16]
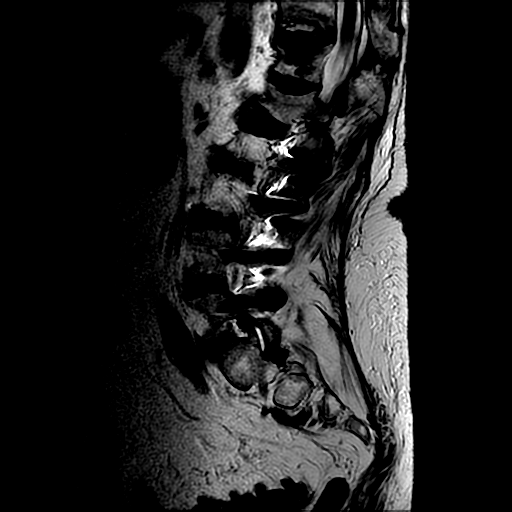
[im 16/16]
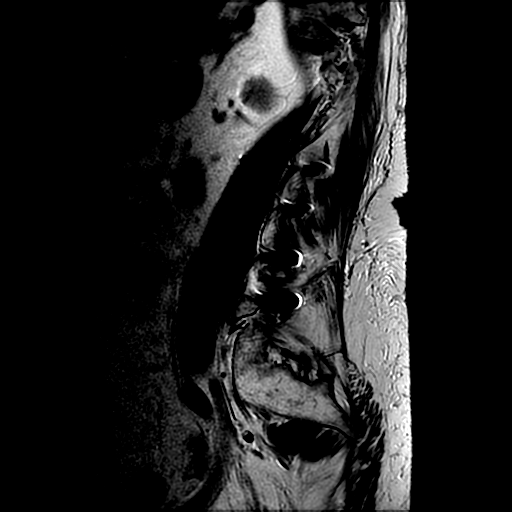

[Series 13: T1 · sagittal · 4.0mm · 0.55mm/px · 3 of 16 slices shown (1 of 2)]
[im 3/16]
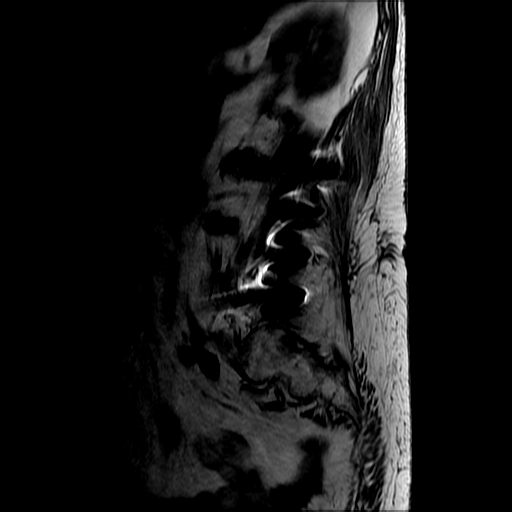
[im 8/16]
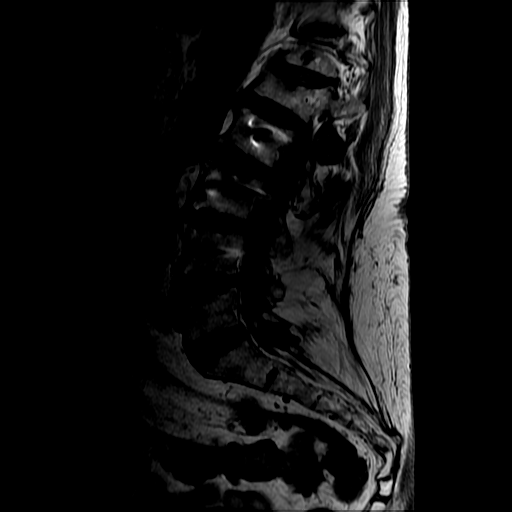
[im 13/16]
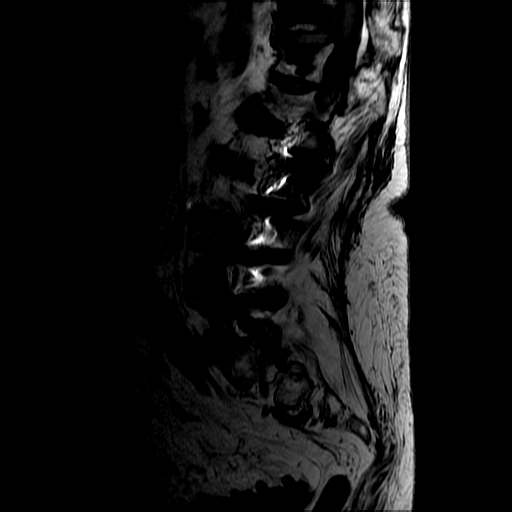

[Series 14: T2 · axial · 4.0mm · 0.39mm/px · z∈[-474,-293]mm · 6 of 35 slices shown (2 of 2)]
[im 1/35]
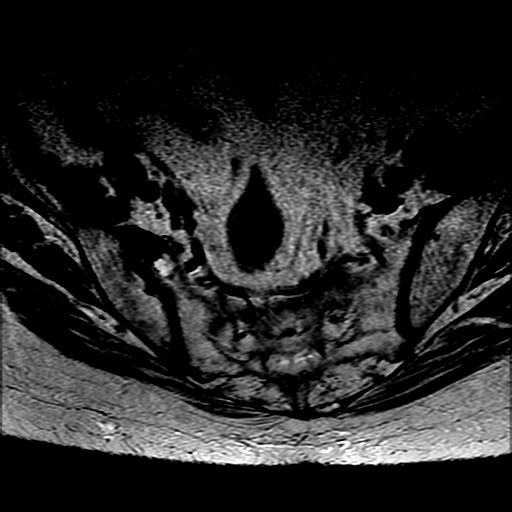
[im 6/35]
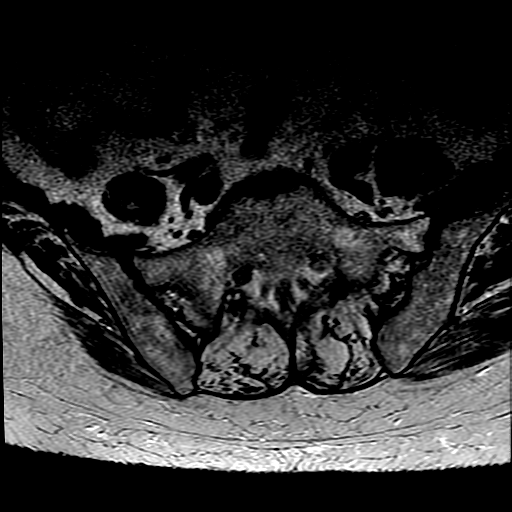
[im 11/35]
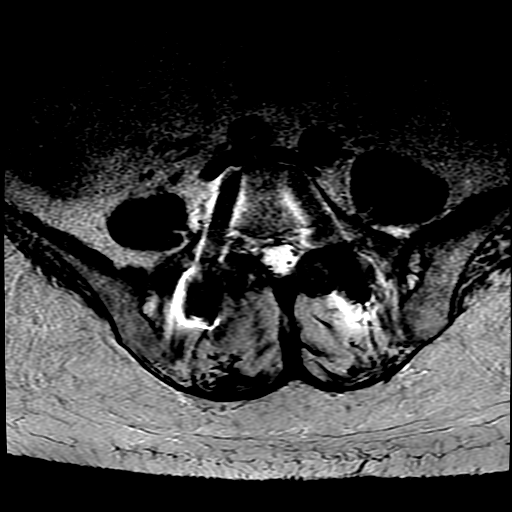
[im 16/35]
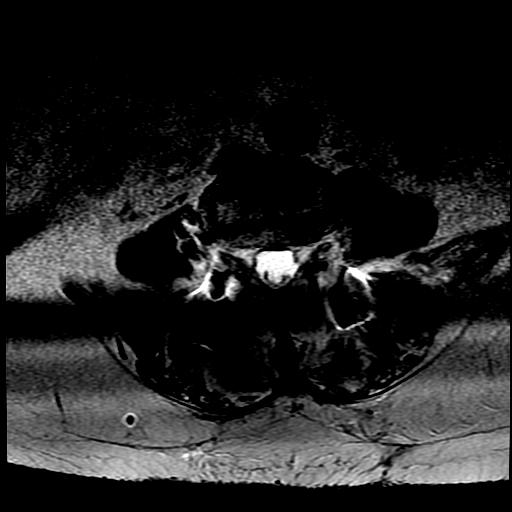
[im 19/35]
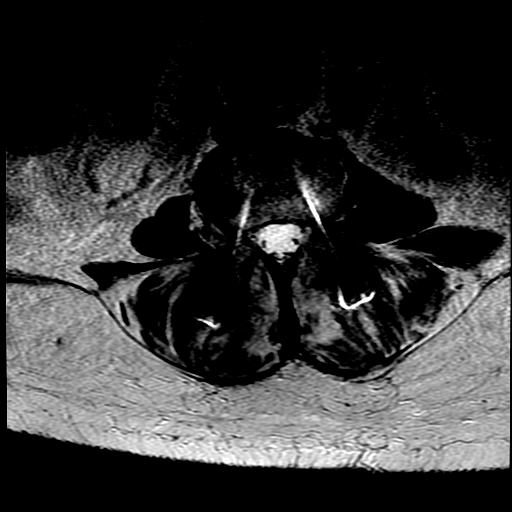
[im 29/35]
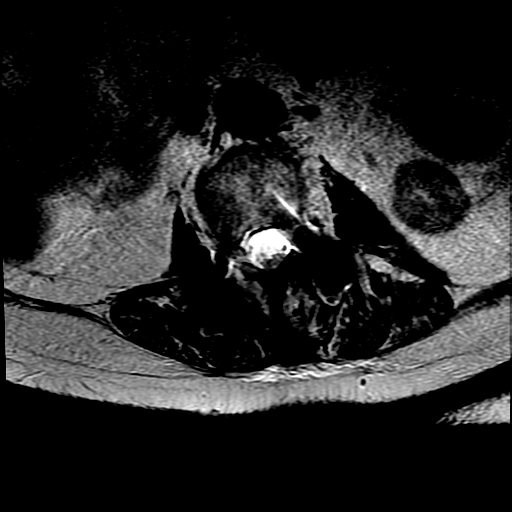

[Series 15: T1 · axial · 4.0mm · 0.39mm/px · z∈[-449,-293]mm · 3 of 35 slices shown (2 of 2)]
[im 6/35]
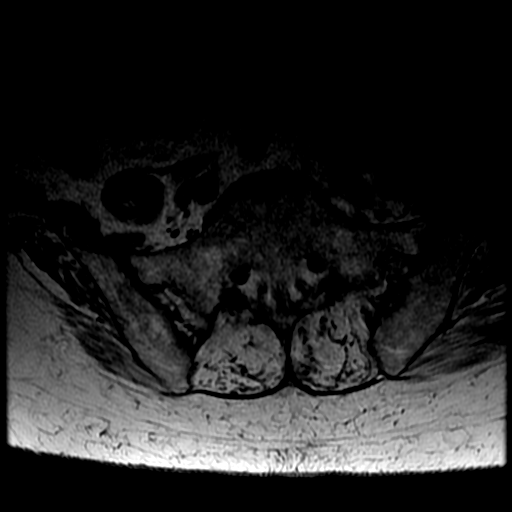
[im 19/35]
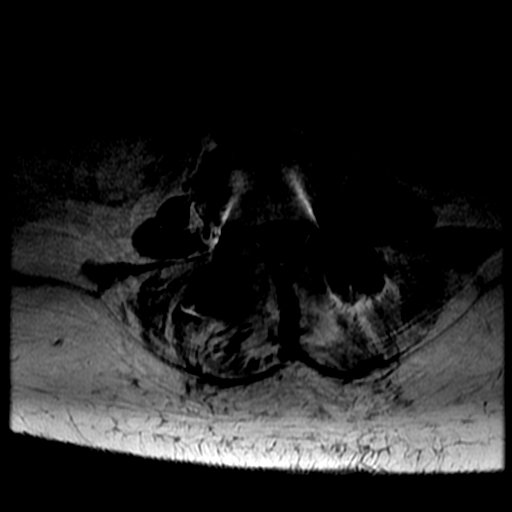
[im 29/35]
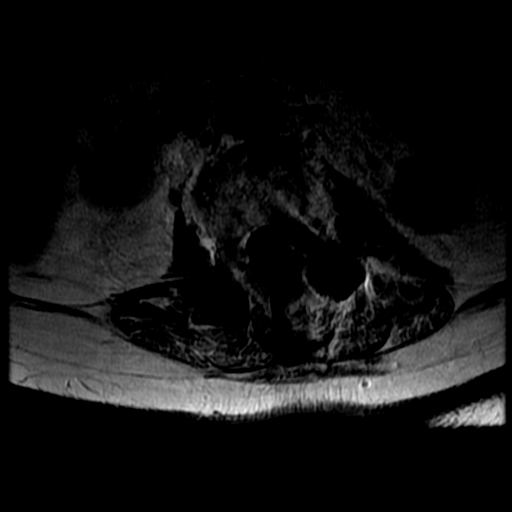

[18 of 48 positions shown; findings below may reference images not displayed]

FINDINGS: MRI LUMBAR SPINE FINDINGS

Segmentation: Normal

Alignment:  Normal

Vertebrae: Negative for acute fracture lumbar spine. Chronic
fracture L1. Thoracic fractures described below.

Conus medullaris and cauda equina: Conus extends to the L1-2 level.
Conus and cauda equina appear normal

Paraspinal and other soft tissues: Negative for paraspinous mass or
adenopathy.

Disc levels:

Bilateral pedicle screw and interbody fusion L2 through L5. Left L2
screw appears to pass through the lateral aspect of the spinal
canal.

Negative for lumbar stenosis.

Extensive facet degeneration bilaterally L5-S1 without stenosis.

MRI THORACIC SPINE FINDINGS

Alignment:  Normal

Vertebrae: Mild fracture inferior endplate of T11. Diffuse bone
marrow edema compatible with acute or subacute fracture.

Chronic fracture of T12 with vertebroplasty cement in the vertebral
body. No retropulsion of bone into the canal and no spinal stenosis
related to these fractures. Scattered hemangiomata.

Spinal cord: Limited evaluation due to motion. No cord signal
abnormality identified and no cord compression.

.

Paraspinal and other soft tissues: Negative for paraspinous mass,
adenopathy, or fluid collection

Disc levels:

Mild disc degeneration T10-11, T11-12, T12-L1.

Negative for thoracic stenosis.  No focal disc protrusion.
IMPRESSION: MR THORACIC SPINE IMPRESSION

Mild fracture of T11 with bone marrow edema compatible with acute or
subacute fracture.

Chronic compression fracture of T12 with prior cement
vertebroplasty. No significant thoracic stenosis.

MR LUMBAR SPINE IMPRESSION

Chronic compression fracture of L1.  No acute lumbar fracture.

Negative for lumbar spinal stenosis. Pedicle screw and interbody
fusion L2 through L5.

## 2021-10-07 IMAGING — MR MR THORACIC SPINE W/O CM
4 of 6 series · 18 of 48 positions shown · non-contrast
Comparison: Thoracolumbar x-rays [DATE]. MRI thoracic lumbar
spine report [DATE]

CLINICAL DATA: Low back pain

EXAM:
MRI THORACIC AND LUMBAR SPINE WITHOUT CONTRAST
TECHNIQUE: Multiplanar and multiecho pulse sequences of the thoracic and lumbar
spine were obtained without intravenous contrast.

[Series 2: counting loc · sagittal · 4.0mm · 0.90mm/px · 3 of 9 slices shown]
[im 1/9]
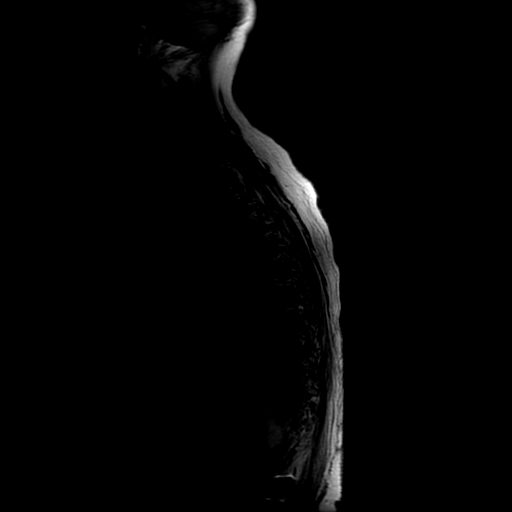
[im 5/9]
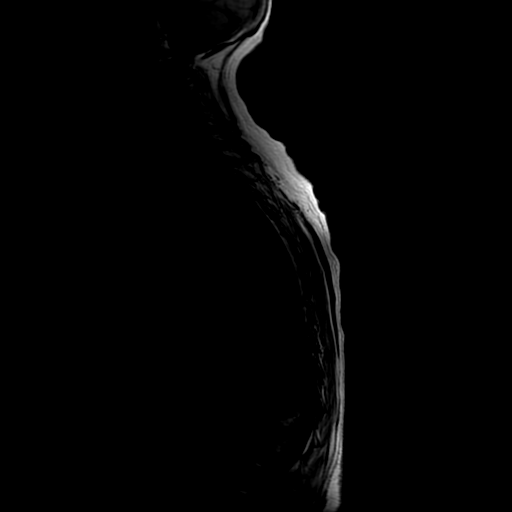
[im 9/9]
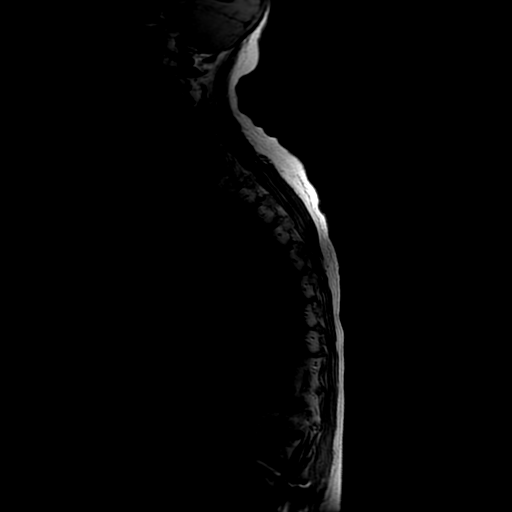

[Series 4: T2 · sagittal · 3.0mm · 0.62mm/px · 5 of 16 slices shown (1 of 2)]
[im 1/16]
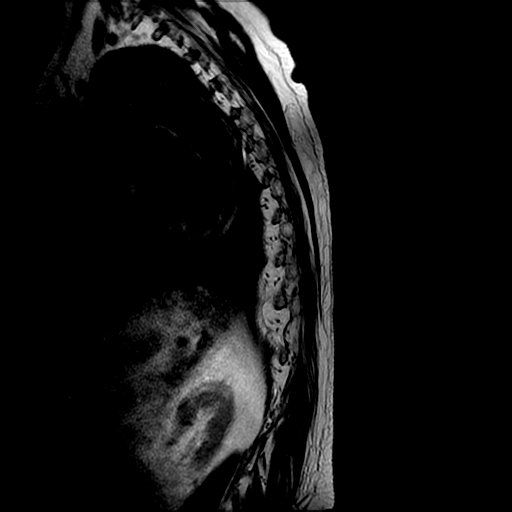
[im 4/16]
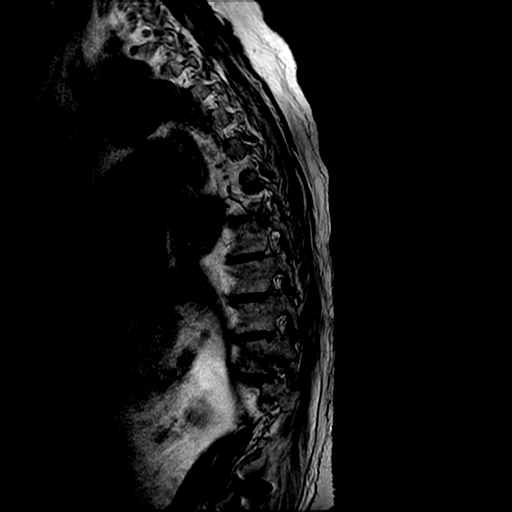
[im 8/16]
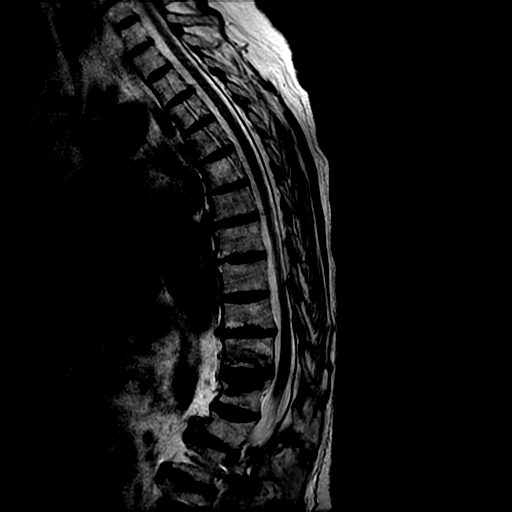
[im 12/16]
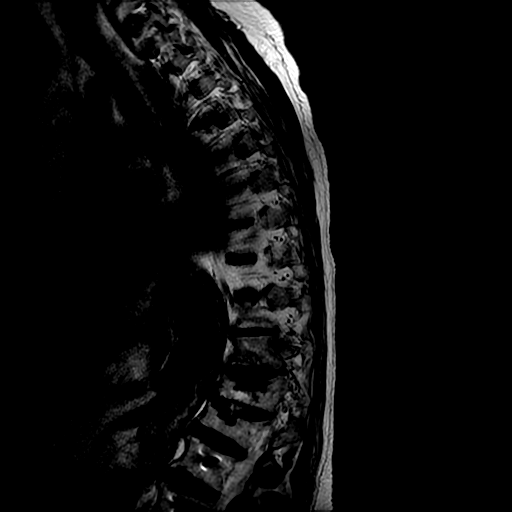
[im 16/16]
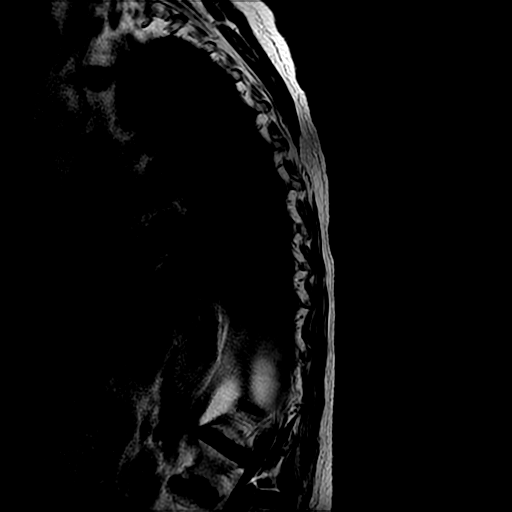

[Series 6: T1 · sagittal · 3.0mm · 0.62mm/px · 3 of 16 slices shown]
[im 1/16]
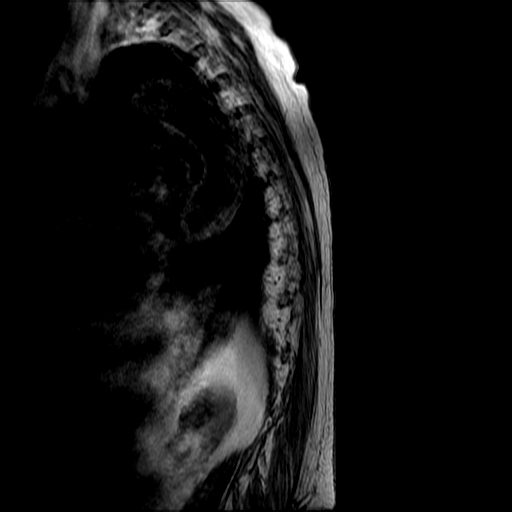
[im 8/16]
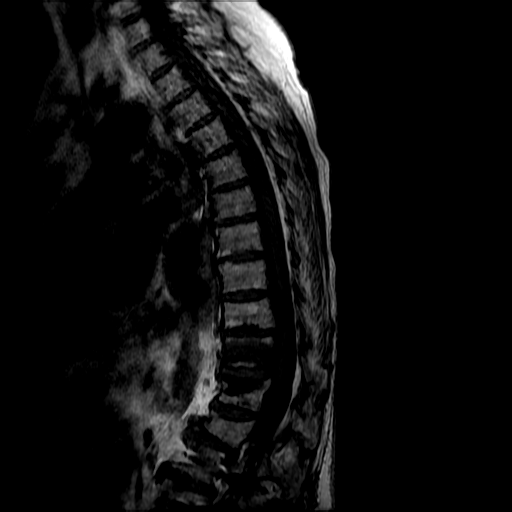
[im 16/16]
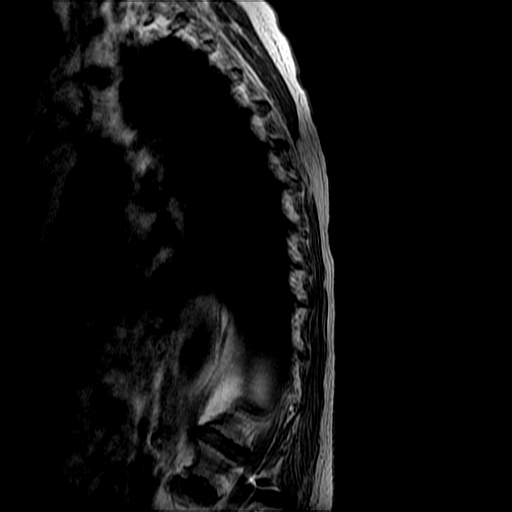

[Series 8: T2 · axial · 4.0mm · 0.43mm/px · z∈[-293,-131]mm · 7 of 45 slices shown (2 of 2)]
[im 1/45]
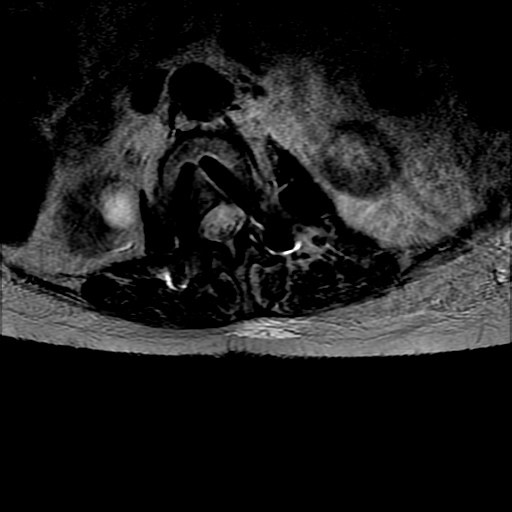
[im 7/45]
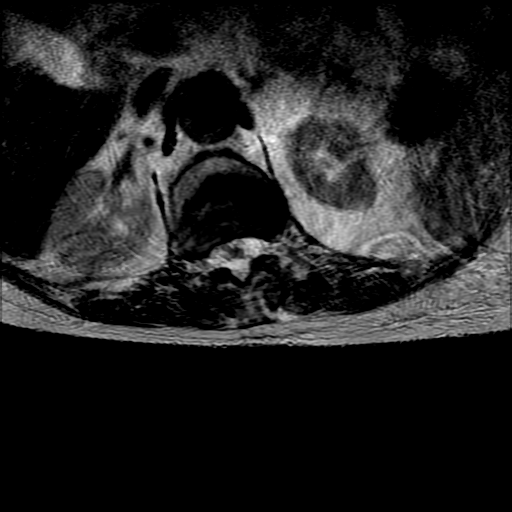
[im 13/45]
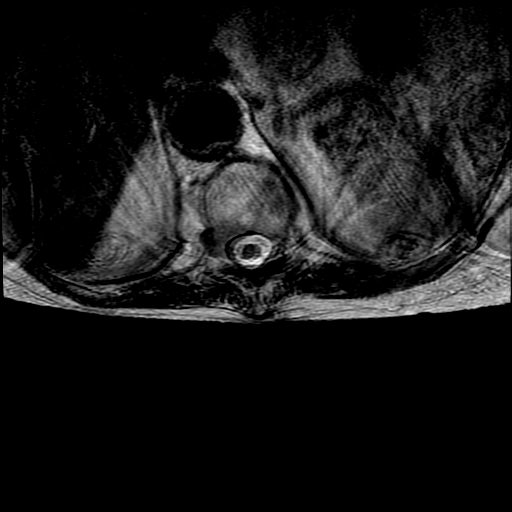
[im 19/45]
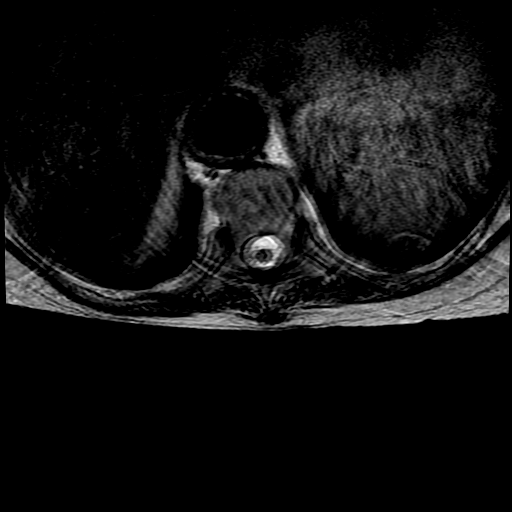
[im 23/45]
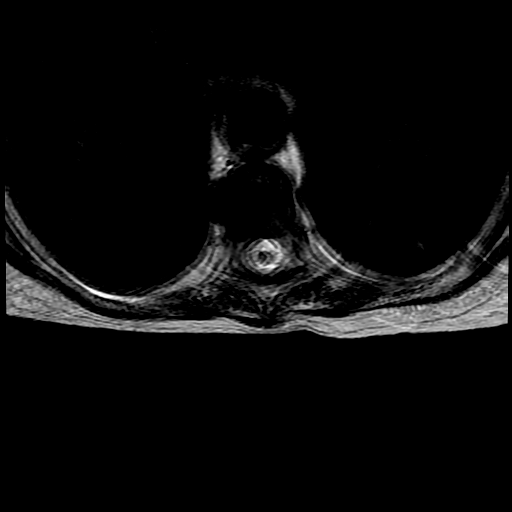
[im 26/45]
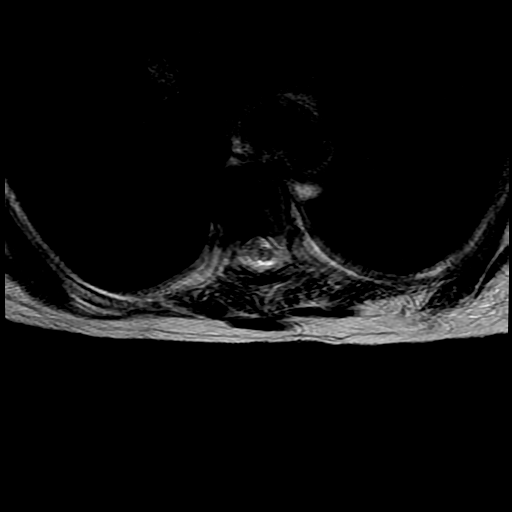
[im 38/45]
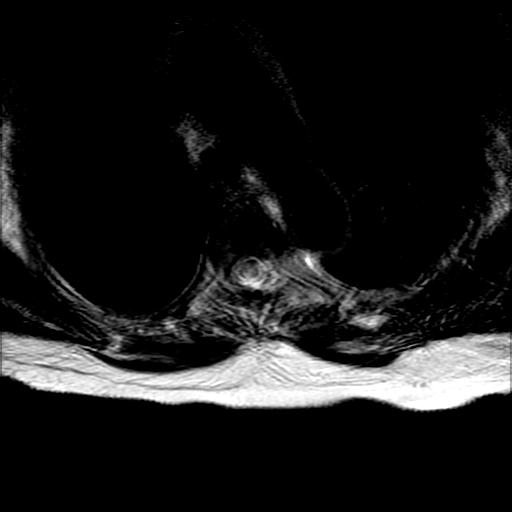

[18 of 48 positions shown; findings below may reference images not displayed]

FINDINGS: MRI LUMBAR SPINE FINDINGS

Segmentation: Normal

Alignment:  Normal

Vertebrae: Negative for acute fracture lumbar spine. Chronic
fracture L1. Thoracic fractures described below.

Conus medullaris and cauda equina: Conus extends to the L1-2 level.
Conus and cauda equina appear normal

Paraspinal and other soft tissues: Negative for paraspinous mass or
adenopathy.

Disc levels:

Bilateral pedicle screw and interbody fusion L2 through L5. Left L2
screw appears to pass through the lateral aspect of the spinal
canal.

Negative for lumbar stenosis.

Extensive facet degeneration bilaterally L5-S1 without stenosis.

MRI THORACIC SPINE FINDINGS

Alignment:  Normal

Vertebrae: Mild fracture inferior endplate of T11. Diffuse bone
marrow edema compatible with acute or subacute fracture.

Chronic fracture of T12 with vertebroplasty cement in the vertebral
body. No retropulsion of bone into the canal and no spinal stenosis
related to these fractures. Scattered hemangiomata.

Spinal cord: Limited evaluation due to motion. No cord signal
abnormality identified and no cord compression.

.

Paraspinal and other soft tissues: Negative for paraspinous mass,
adenopathy, or fluid collection

Disc levels:

Mild disc degeneration T10-11, T11-12, T12-L1.

Negative for thoracic stenosis.  No focal disc protrusion.
IMPRESSION: MR THORACIC SPINE IMPRESSION

Mild fracture of T11 with bone marrow edema compatible with acute or
subacute fracture.

Chronic compression fracture of T12 with prior cement
vertebroplasty. No significant thoracic stenosis.

MR LUMBAR SPINE IMPRESSION

Chronic compression fracture of L1.  No acute lumbar fracture.

Negative for lumbar spinal stenosis. Pedicle screw and interbody
fusion L2 through L5.

## 2021-10-29 ENCOUNTER — Other Ambulatory Visit: Payer: Self-pay | Admitting: Orthopedic Surgery

## 2021-11-19 ENCOUNTER — Ambulatory Visit: Admit: 2021-11-19 | Payer: PRIVATE HEALTH INSURANCE | Admitting: Orthopedic Surgery

## 2021-11-19 SURGERY — KYPHOPLASTY
Anesthesia: General

## 2024-05-29 DEATH — deceased
